# Patient Record
Sex: Female | Born: 1965
Health system: Southern US, Community
[De-identification: ages and names within clinical notes are randomized; demographics above are authoritative.]

## PROBLEM LIST (undated history)

## (undated) DIAGNOSIS — G43909 Migraine, unspecified, not intractable, without status migrainosus: Secondary | ICD-10-CM

## (undated) DIAGNOSIS — M810 Age-related osteoporosis without current pathological fracture: Secondary | ICD-10-CM

## (undated) DIAGNOSIS — F329 Major depressive disorder, single episode, unspecified: Secondary | ICD-10-CM

## (undated) DIAGNOSIS — F419 Anxiety disorder, unspecified: Secondary | ICD-10-CM

## (undated) DIAGNOSIS — J45909 Unspecified asthma, uncomplicated: Secondary | ICD-10-CM

## (undated) DIAGNOSIS — T7840XA Allergy, unspecified, initial encounter: Secondary | ICD-10-CM

## (undated) DIAGNOSIS — F32A Depression, unspecified: Secondary | ICD-10-CM

## (undated) HISTORY — PX: TONSILLECTOMY AND ADENOIDECTOMY: SHX28

## (undated) HISTORY — DX: Unspecified asthma, uncomplicated: J45.909

## (undated) HISTORY — DX: Major depressive disorder, single episode, unspecified: F32.9

## (undated) HISTORY — DX: Allergy, unspecified, initial encounter: T78.40XA

## (undated) HISTORY — DX: Age-related osteoporosis without current pathological fracture: M81.0

## (undated) HISTORY — DX: Migraine, unspecified, not intractable, without status migrainosus: G43.909

## (undated) HISTORY — DX: Depression, unspecified: F32.A

## (undated) HISTORY — PX: WISDOM TOOTH EXTRACTION: SHX21

---
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
## 2002-04-26 ENCOUNTER — Ambulatory Visit (HOSPITAL_COMMUNITY): Admission: RE | Admit: 2002-04-26 | Discharge: 2002-04-26 | Payer: Self-pay | Admitting: Obstetrics and Gynecology

## 2002-04-26 ENCOUNTER — Encounter: Payer: Self-pay | Admitting: Obstetrics and Gynecology

## 2002-06-04 ENCOUNTER — Other Ambulatory Visit: Admission: RE | Admit: 2002-06-04 | Discharge: 2002-06-04 | Payer: Self-pay | Admitting: Obstetrics and Gynecology

## 2003-02-28 ENCOUNTER — Inpatient Hospital Stay (HOSPITAL_COMMUNITY): Admission: AD | Admit: 2003-02-28 | Discharge: 2003-02-28 | Payer: Self-pay | Admitting: Obstetrics and Gynecology

## 2003-07-20 ENCOUNTER — Other Ambulatory Visit: Admission: RE | Admit: 2003-07-20 | Discharge: 2003-07-20 | Payer: Self-pay | Admitting: Obstetrics and Gynecology

## 2003-08-17 ENCOUNTER — Ambulatory Visit (HOSPITAL_COMMUNITY): Admission: RE | Admit: 2003-08-17 | Discharge: 2003-08-17 | Payer: Self-pay | Admitting: Obstetrics and Gynecology

## 2003-08-19 ENCOUNTER — Encounter: Admission: RE | Admit: 2003-08-19 | Discharge: 2003-08-19 | Payer: Self-pay | Admitting: Obstetrics and Gynecology

## 2003-10-12 ENCOUNTER — Inpatient Hospital Stay (HOSPITAL_COMMUNITY): Admission: AD | Admit: 2003-10-12 | Discharge: 2003-10-12 | Payer: Self-pay | Admitting: Obstetrics and Gynecology

## 2005-01-16 ENCOUNTER — Other Ambulatory Visit: Admission: RE | Admit: 2005-01-16 | Discharge: 2005-01-16 | Payer: Self-pay | Admitting: Obstetrics and Gynecology

## 2005-06-27 ENCOUNTER — Encounter: Admission: RE | Admit: 2005-06-27 | Discharge: 2005-06-27 | Payer: Self-pay | Admitting: Obstetrics and Gynecology

## 2012-07-06 ENCOUNTER — Encounter: Payer: Self-pay | Admitting: Family Medicine

## 2012-08-19 ENCOUNTER — Encounter: Payer: Self-pay | Admitting: Family Medicine

## 2012-08-19 ENCOUNTER — Ambulatory Visit (INDEPENDENT_AMBULATORY_CARE_PROVIDER_SITE_OTHER): Payer: BC Managed Care – PPO | Admitting: Family Medicine

## 2012-08-19 VITALS — BP 100/80 | HR 68 | Temp 97.7°F | Ht 64.5 in | Wt 106.2 lb

## 2012-08-19 DIAGNOSIS — J4520 Mild intermittent asthma, uncomplicated: Secondary | ICD-10-CM | POA: Insufficient documentation

## 2012-08-19 DIAGNOSIS — F418 Other specified anxiety disorders: Secondary | ICD-10-CM | POA: Insufficient documentation

## 2012-08-19 DIAGNOSIS — F3289 Other specified depressive episodes: Secondary | ICD-10-CM

## 2012-08-19 DIAGNOSIS — F329 Major depressive disorder, single episode, unspecified: Secondary | ICD-10-CM

## 2012-08-19 DIAGNOSIS — M810 Age-related osteoporosis without current pathological fracture: Secondary | ICD-10-CM | POA: Insufficient documentation

## 2012-08-19 DIAGNOSIS — F32A Depression, unspecified: Secondary | ICD-10-CM

## 2012-08-19 DIAGNOSIS — R55 Syncope and collapse: Secondary | ICD-10-CM

## 2012-08-19 DIAGNOSIS — J45909 Unspecified asthma, uncomplicated: Secondary | ICD-10-CM

## 2012-08-19 DIAGNOSIS — G43829 Menstrual migraine, not intractable, without status migrainosus: Secondary | ICD-10-CM

## 2012-08-19 LAB — CBC WITH DIFFERENTIAL/PLATELET
Basophils Absolute: 0.1 10*3/uL (ref 0.0–0.1)
HCT: 37.8 % (ref 36.0–46.0)
Hemoglobin: 12.5 g/dL (ref 12.0–15.0)
Lymphs Abs: 1.5 10*3/uL (ref 0.7–4.0)
MCV: 91.3 fl (ref 78.0–100.0)
Monocytes Absolute: 0.6 10*3/uL (ref 0.1–1.0)
Neutro Abs: 3.8 10*3/uL (ref 1.4–7.7)
Platelets: 230 10*3/uL (ref 150.0–400.0)
RDW: 13.6 % (ref 11.5–14.6)

## 2012-08-19 LAB — LDL CHOLESTEROL, DIRECT: Direct LDL: 133.6 mg/dL

## 2012-08-19 LAB — HEPATIC FUNCTION PANEL
Albumin: 4.1 g/dL (ref 3.5–5.2)
Alkaline Phosphatase: 43 U/L (ref 39–117)
Bilirubin, Direct: 0.1 mg/dL (ref 0.0–0.3)
Total Protein: 7.3 g/dL (ref 6.0–8.3)

## 2012-08-19 LAB — LIPID PANEL
Cholesterol: 228 mg/dL — ABNORMAL HIGH (ref 0–200)
HDL: 77.8 mg/dL (ref >39.00)
Triglycerides: 78 mg/dL (ref 0.0–149.0)

## 2012-08-19 LAB — BASIC METABOLIC PANEL
Calcium: 9.2 mg/dL (ref 8.4–10.5)
Creatinine, Ser: 0.6 mg/dL (ref 0.4–1.2)
GFR: 117.78 mL/min (ref >60.00)
Glucose, Bld: 84 mg/dL (ref 70–99)
Sodium: 136 mEq/L (ref 135–145)

## 2012-08-19 NOTE — Assessment & Plan Note (Signed)
New to provider, ongoing for pt.  Feels sxs are currently well controlled.  No changes.

## 2012-08-19 NOTE — Assessment & Plan Note (Signed)
New to provider, dx'd by GYN.  Not currently on bisphosphonate.  On Ca and Vit D daily.  Stressed importance of weight bearing exercises.  Will follow.

## 2012-08-19 NOTE — Assessment & Plan Note (Signed)
New to provider, ongoing for pt.  Only using Advair PRN- which is rarely.  Has not required albuterol recently.  No changes.  Will follow.

## 2012-08-19 NOTE — Assessment & Plan Note (Signed)
New.  No obvious cause- pt did have onset of menses that day.  Check labs to r/o metabolic cause.  Reviewed supportive care and red flags that should prompt return.  Pt expressed understanding and is in agreement w/ plan.

## 2012-08-19 NOTE — Patient Instructions (Addendum)
We'll notify you of your lab results and make any changes if needed Keep up the good work!  You look great! Call for refills, questions or concerns Welcome!  We're glad to have you!!!

## 2012-08-19 NOTE — Progress Notes (Signed)
  Subjective:    Patient ID: Jasmine Herrera, female    DOB: 09-Dec-1965, 47 y.o.   MRN: [id number]  HPI New to establish.  Previous MD- None.  GYN- Grewal  Migraines- chronic problem.  In the past has had as many as 9 per month.  Is now having less, ~6 per month.  Will take Relpax prn w/ good relief.  Migraines are hormonally mediated.  Will have 'mental clouding' prior to onset but no true aura.  + nausea.    Asthma- chronic problem.  Pt reports sxs have improved since starting sublingual allergy drops.  Trying to avoid using Advair due to osteoporosis.  Has run out of albuterol rescue inhaler 'years ago' and has not required one.  Will use Advair PRN.  No SOB, CP.  Osteoporosis- dx'd on DEXA 'a couple of years ago', not currently on meds.  Pt does take Ca and Vit D daily, walks regularly.  Depression- chronic problem, sxs started after divorce 3 yrs ago.  On Prozac.  Feels sxs are well controlled.  Sleeping well, eating regularly.  Presyncopal episode- occurred 05/12/12.  Had eaten, only 1 drink.  No hx of similar.  No episodes since.  Denies CP, SOB, visual changes, edema.   Review of Systems For ROS see HPI     Objective:   Physical Exam  Vitals reviewed. Constitutional: She is oriented to person, place, and time. She appears well-developed and well-nourished. No distress.  HENT:  Head: Normocephalic and atraumatic.  Eyes: Conjunctivae and EOM are normal. Pupils are equal, round, and reactive to light.  Neck: Normal range of motion. Neck supple. No thyromegaly present.  Cardiovascular: Normal rate, regular rhythm, normal heart sounds and intact distal pulses.   No murmur heard. Pulmonary/Chest: Effort normal and breath sounds normal. No respiratory distress.  Abdominal: Soft. She exhibits no distension. There is no tenderness.  Musculoskeletal: She exhibits no edema.  Lymphadenopathy:    She has no cervical adenopathy.  Neurological: She is alert and oriented to person, place, and  time. She has normal reflexes. No cranial nerve deficit. Coordination normal.  Skin: Skin is warm and dry.  Psychiatric: She has a normal mood and affect. Her behavior is normal.          Assessment & Plan:

## 2012-08-19 NOTE — Assessment & Plan Note (Signed)
New to provider, ongoing for pt.  Not currently seeing Neuro.  On triptan prn.  Reviewed supportive care and red flags that should prompt return.  Pt expressed understanding and is in agreement w/ plan.

## 2012-08-20 ENCOUNTER — Encounter: Payer: Self-pay | Admitting: *Deleted

## 2012-08-24 ENCOUNTER — Encounter: Payer: Self-pay | Admitting: *Deleted

## 2012-08-24 LAB — VITAMIN D 1,25 DIHYDROXY: Vitamin D 1, 25 (OH)2 Total: 70 pg/mL (ref 18–72)

## 2013-01-04 ENCOUNTER — Telehealth: Payer: Self-pay | Admitting: Family Medicine

## 2013-01-04 NOTE — Telephone Encounter (Deleted)
Patient is calling to

## 2013-01-12 NOTE — Telephone Encounter (Signed)
error 

## 2013-02-25 ENCOUNTER — Other Ambulatory Visit: Payer: Self-pay | Admitting: Family Medicine

## 2013-02-25 NOTE — Telephone Encounter (Signed)
Patient is calling to request a refill on the following prescriptions:  eletriptan (RELPAX) 40 MG tablet (requesting 90 days) FLUoxetine (PROZAC) 20 MG capsule   She uses [facility].

## 2013-02-26 MED ORDER — ELETRIPTAN HYDROBROMIDE 40 MG PO TABS: 40.0000 mg | ORAL_TABLET | ORAL | Status: DC | PRN

## 2013-02-26 MED ORDER — FLUOXETINE HCL 20 MG PO CAPS: 20.0000 mg | ORAL_CAPSULE | Freq: Every day | ORAL | Status: DC

## 2013-02-26 NOTE — Telephone Encounter (Signed)
Meds filled

## 2013-02-26 NOTE — Telephone Encounter (Signed)
Pt last seen in April, no record of [place] previously filling medication. Please advise.

## 2013-04-19 ENCOUNTER — Telehealth: Payer: Self-pay | Admitting: Family Medicine

## 2013-04-19 NOTE — Telephone Encounter (Signed)
Ok for refill- change quantity to #12, 3 refills

## 2013-04-19 NOTE — Telephone Encounter (Signed)
[facility] patient. Please advise     KP

## 2013-04-19 NOTE — Telephone Encounter (Signed)
Patient called and stated that she ran out of her migraine medication because she had a ruff month. Patient wanted to see if dr Laury Axon could increase the quantity to 12 or write her a new rx for eletriptan (RELPAX) 40 MG tablet. Also patient states that we might have to call her insurance to see if they will cover it or recommend a new prescription for her.     Pharmacy [address] - [place], [place] - [address]

## 2013-04-21 ENCOUNTER — Other Ambulatory Visit: Payer: Self-pay | Admitting: *Deleted

## 2013-04-21 DIAGNOSIS — G43909 Migraine, unspecified, not intractable, without status migrainosus: Secondary | ICD-10-CM

## 2013-04-21 MED ORDER — ELETRIPTAN HYDROBROMIDE 40 MG PO TABS: 40.0000 mg | ORAL_TABLET | ORAL | Status: DC | PRN

## 2013-04-21 NOTE — Telephone Encounter (Signed)
Refill for Relpax sent to [facility] [address] for #12 with 3 refills

## 2013-06-08 ENCOUNTER — Encounter: Payer: Self-pay | Admitting: Emergency Medicine

## 2013-06-08 ENCOUNTER — Ambulatory Visit (INDEPENDENT_AMBULATORY_CARE_PROVIDER_SITE_OTHER): Payer: BC Managed Care – PPO | Admitting: Emergency Medicine

## 2013-06-08 VITALS — BP 100/60 | HR 90 | Temp 98.2°F | Resp 16 | Ht 64.0 in | Wt 104.0 lb

## 2013-06-08 DIAGNOSIS — R350 Frequency of micturition: Secondary | ICD-10-CM

## 2013-06-08 DIAGNOSIS — N390 Urinary tract infection, site not specified: Secondary | ICD-10-CM

## 2013-06-08 LAB — POCT URINALYSIS DIPSTICK
BILIRUBIN UA: NEGATIVE
Glucose, UA: NEGATIVE
Ketones, UA: NEGATIVE
NITRITE UA: NEGATIVE
Protein, UA: NEGATIVE
Spec Grav, UA: <=1.005
Urobilinogen, UA: 0.2
pH, UA: 6.5

## 2013-06-08 LAB — POCT UA - MICROSCOPIC ONLY
Casts, Ur, LPF, POC: NEGATIVE
Crystals, Ur, HPF, POC: NEGATIVE
MUCUS UA: NEGATIVE
YEAST UA: NEGATIVE

## 2013-06-08 MED ORDER — CIPROFLOXACIN HCL 500 MG PO TABS: 500.0000 mg | ORAL_TABLET | Freq: Two times a day (BID) | ORAL | Status: DC

## 2013-06-08 NOTE — Patient Instructions (Signed)
Urinary Tract Infection  Urinary tract infections (UTIs) can develop anywhere along your urinary tract. Your urinary tract is your body's drainage system for removing wastes and extra water. Your urinary tract includes two kidneys, two ureters, a bladder, and a urethra. Your kidneys are a pair of bean-shaped organs. Each kidney is about the size of your fist. They are located below your ribs, one on each side of your spine.  CAUSES  Infections are caused by microbes, which are microscopic organisms, including fungi, viruses, and bacteria. These organisms are so small that they can only be seen through a microscope. Bacteria are the microbes that most commonly cause UTIs.  SYMPTOMS   Symptoms of UTIs may vary by age and gender of the patient and by the location of the infection. Symptoms in young women typically include a frequent and intense urge to urinate and a painful, burning feeling in the bladder or urethra during urination. Older women and men are more likely to be tired, shaky, and weak and have muscle aches and abdominal pain. A fever may mean the infection is in your kidneys. Other symptoms of a kidney infection include pain in your back or sides below the ribs, nausea, and vomiting.  DIAGNOSIS  To diagnose a UTI, your caregiver will ask you about your symptoms. Your caregiver also will ask to provide a urine sample. The urine sample will be tested for bacteria and white blood cells. White blood cells are made by your body to help fight infection.  TREATMENT   Typically, UTIs can be treated with medication. Because most UTIs are caused by a bacterial infection, they usually can be treated with the use of antibiotics. The choice of antibiotic and length of treatment depend on your symptoms and the type of bacteria causing your infection.  HOME CARE INSTRUCTIONS   If you were prescribed antibiotics, take them exactly as your caregiver instructs you. Finish the medication even if you feel better after you  have only taken some of the medication.   Drink enough water and fluids to keep your urine clear or pale yellow.   Avoid caffeine, tea, and carbonated beverages. They tend to irritate your bladder.   Empty your bladder often. Avoid holding urine for long periods of time.   Empty your bladder before and after sexual intercourse.   After a bowel movement, women should cleanse from front to back. Use each tissue only once.  SEEK MEDICAL CARE IF:    You have back pain.   You develop a fever.   Your symptoms do not begin to resolve within 3 days.  SEEK IMMEDIATE MEDICAL CARE IF:    You have severe back pain or lower abdominal pain.   You develop chills.   You have nausea or vomiting.   You have continued burning or discomfort with urination.  MAKE SURE YOU:    Understand these instructions.   Will watch your condition.   Will get help right away if you are not doing well or get worse.  Document Released: 02/06/2005 Document Revised: 10/29/2011 Document Reviewed: 06/07/2011  ExitCare Patient Information 2014 [employer].

## 2013-06-08 NOTE — Progress Notes (Signed)
   Subjective:    Patient ID: Jasmine Herrera, female    DOB: 12/27/1965, 48 y.o.   MRN: [id number]  HPI Urinary frequency with increased urination since last night and lower abdominal discomfort.  No fever or chills.  No back pain.  Similar symptoms in past, several years ago.    PPMH:  No diabetes, no htn.  SH:  Nonsmoker, no alcohol.  ALL:  PCN  Review of Systems  Constitutional: Negative for fever, chills, diaphoresis, appetite change and fatigue.  Respiratory: Negative for cough, chest tightness and shortness of breath.   Gastrointestinal: Negative for nausea, diarrhea and constipation.  Genitourinary: Positive for urgency and frequency. Negative for flank pain.  Neurological: Negative for numbness and headaches.       Objective:   Physical Exam Blood pressure 100/60, pulse 90, temperature 98.2 F (36.8 C), temperature source Oral, resp. rate 16, height 5\' 4"  (1.626 m), weight 104 lb (47.174 kg), last menstrual period 05/11/2013, SpO2 98.00%. Body mass index is 17.84 kg/(m^2). Well-developed, well nourished female who is awake, alert and oriented, in NAD. HEENT: [place]/AT, PERRL, EOMI.  Sclera and conjunctiva are clear. Neck: supple, non-tender Heart: RRR, no murmur Lungs: normal effort, CTA Abdomen: normo-active bowel sounds, supple, non-tender, no mass or organomegaly. Extremities: no cyanosis, clubbing or edema. Skin: warm and dry without rash. Psychologic: good mood and appropriate affect, normal speech and behavior.   Results for orders placed in visit on 06/08/13  POCT UA - MICROSCOPIC ONLY      Result Value Range   WBC, Ur, HPF, POC tntc     RBC, urine, microscopic 0-3     Bacteria, U Microscopic 3+     Mucus, UA neg     Epithelial cells, urine per micros 5-10     Crystals, Ur, HPF, POC neg     Casts, Ur, LPF, POC neg     Yeast, UA neg    POCT URINALYSIS DIPSTICK      Result Value Range   Color, UA light yellow     Clarity, UA hazy     Glucose, UA neg     Bilirubin, UA neg     Ketones, UA neg     Spec Grav, UA <=1.005     Blood, UA moderate     pH, UA 6.5     Protein, UA neg     Urobilinogen, UA 0.2     Nitrite, UA neg     Leukocytes, UA large (3+)          Assessment & Plan:  UTI  Will treat with cipro x 5 days.  Return if worsen.

## 2013-06-30 ENCOUNTER — Other Ambulatory Visit: Payer: Self-pay | Admitting: Family Medicine

## 2013-06-30 NOTE — Telephone Encounter (Signed)
Last OV 08-19-12  Med filled 04-21-13 #12 with 3 refills

## 2013-07-20 ENCOUNTER — Other Ambulatory Visit: Payer: Self-pay | Admitting: General Practice

## 2013-07-20 DIAGNOSIS — G43909 Migraine, unspecified, not intractable, without status migrainosus: Secondary | ICD-10-CM

## 2013-07-20 MED ORDER — FLUTICASONE-SALMETEROL 250-50 MCG/DOSE IN AEPB: 1.0000 | INHALATION_SPRAY | RESPIRATORY_TRACT | Status: DC | PRN

## 2013-07-20 MED ORDER — ELETRIPTAN HYDROBROMIDE 40 MG PO TABS: 40.0000 mg | ORAL_TABLET | ORAL | Status: DC | PRN

## 2013-07-20 NOTE — Telephone Encounter (Signed)
Med filled.  

## 2013-07-26 ENCOUNTER — Telehealth: Payer: Self-pay | Admitting: *Deleted

## 2013-07-26 NOTE — Telephone Encounter (Signed)
Prior authorization paperwork faxed for Relpax 40 mg. Awaiting response. JG//CMA

## 2013-07-27 NOTE — Telephone Encounter (Signed)
Patient called and was upset that her pharmacy have sent numerous of request to refill her Relpax. Also she wanted to see if it could be approved for 9 tablets instead of 6 and is there a way to not have to go through this every month to get this refilled. Patient would like a phone call today.

## 2013-07-27 NOTE — Telephone Encounter (Signed)
Patient made aware that prior authorization paperwork has been faxed for Replax 40mg .  Patient requested that will call insurance company regarding medication.  [facility] asked to re-fax prior authorization form.  Awaiting fax.

## 2013-07-28 NOTE — Telephone Encounter (Signed)
Called and spoke with [employer] [place] concerning PA for Relpax. They stated that the decision is pending and we would receive a decision as soon as possible. JG//CMA

## 2013-07-28 NOTE — Telephone Encounter (Signed)
Received confirmation that PA was approved effective from 07/28/2013 through 05/12/2038. JG//CMA

## 2013-07-28 NOTE — Telephone Encounter (Signed)
Murtis Sink, CMA to check on update of prior authorization status.

## 2013-10-11 LAB — HM PAP SMEAR: HM PAP: NORMAL

## 2013-11-08 ENCOUNTER — Other Ambulatory Visit: Payer: Self-pay | Admitting: Obstetrics and Gynecology

## 2013-11-08 DIAGNOSIS — N6001 Solitary cyst of right breast: Secondary | ICD-10-CM

## 2013-11-26 ENCOUNTER — Ambulatory Visit
Admission: RE | Admit: 2013-11-26 | Discharge: 2013-11-26 | Disposition: A | Discharge status: Home / Self Care | Payer: BC Managed Care – PPO | Source: Ambulatory Visit | Attending: Obstetrics and Gynecology | Admitting: Obstetrics and Gynecology

## 2013-11-26 DIAGNOSIS — N6001 Solitary cyst of right breast: Secondary | ICD-10-CM

## 2014-02-19 ENCOUNTER — Other Ambulatory Visit: Payer: Self-pay | Admitting: Family Medicine

## 2014-02-19 NOTE — Telephone Encounter (Signed)
Last OV 08-19-12 Med filled 07-20-13 #60 with 0

## 2014-02-20 NOTE — Telephone Encounter (Signed)
This is pt's last refill w/o appt.  Has not been seen in 18 months.

## 2014-02-21 NOTE — Telephone Encounter (Signed)
Med filled and faxed.  

## 2014-06-09 ENCOUNTER — Encounter: Payer: Self-pay | Admitting: Family Medicine

## 2014-06-09 ENCOUNTER — Ambulatory Visit (INDEPENDENT_AMBULATORY_CARE_PROVIDER_SITE_OTHER): Payer: 59 | Admitting: Family Medicine

## 2014-06-09 VITALS — BP 110/80 | HR 118 | Temp 98.2°F | Resp 16 | Wt 102.1 lb

## 2014-06-09 DIAGNOSIS — N390 Urinary tract infection, site not specified: Secondary | ICD-10-CM

## 2014-06-09 DIAGNOSIS — G43829 Menstrual migraine, not intractable, without status migrainosus: Secondary | ICD-10-CM

## 2014-06-09 DIAGNOSIS — F32A Depression, unspecified: Secondary | ICD-10-CM

## 2014-06-09 DIAGNOSIS — F329 Major depressive disorder, single episode, unspecified: Secondary | ICD-10-CM

## 2014-06-09 DIAGNOSIS — J45998 Other asthma: Secondary | ICD-10-CM

## 2014-06-09 DIAGNOSIS — N943 Premenstrual tension syndrome: Secondary | ICD-10-CM

## 2014-06-09 DIAGNOSIS — R82998 Other abnormal findings in urine: Secondary | ICD-10-CM

## 2014-06-09 DIAGNOSIS — R300 Dysuria: Secondary | ICD-10-CM

## 2014-06-09 DIAGNOSIS — J45909 Unspecified asthma, uncomplicated: Secondary | ICD-10-CM

## 2014-06-09 LAB — POCT URINALYSIS DIPSTICK
Bilirubin, UA: NEGATIVE
Glucose, UA: NEGATIVE
Ketones, UA: NEGATIVE
Nitrite, UA: NEGATIVE
PROTEIN UA: NEGATIVE
RBC UA: NEGATIVE
SPEC GRAV UA: 1.015
Urobilinogen, UA: 2
pH, UA: 7

## 2014-06-09 MED ORDER — ELETRIPTAN HYDROBROMIDE 40 MG PO TABS: 40.0000 mg | ORAL_TABLET | Freq: Once | ORAL | Status: DC

## 2014-06-09 MED ORDER — SUMATRIPTAN SUCCINATE 100 MG PO TABS: 100.0000 mg | ORAL_TABLET | Freq: Once | ORAL | Status: DC

## 2014-06-09 MED ORDER — FLUCONAZOLE 150 MG PO TABS: 150.0000 mg | ORAL_TABLET | Freq: Once | ORAL | Status: DC

## 2014-06-09 MED ORDER — FLUOXETINE HCL 20 MG PO TABS: 20.0000 mg | ORAL_TABLET | Freq: Every day | ORAL | Status: DC

## 2014-06-09 MED ORDER — ALBUTEROL SULFATE HFA 108 (90 BASE) MCG/ACT IN AERS: 2.0000 | INHALATION_SPRAY | Freq: Four times a day (QID) | RESPIRATORY_TRACT | Status: DC | PRN

## 2014-06-09 MED ORDER — CIPROFLOXACIN HCL 500 MG PO TABS: 500.0000 mg | ORAL_TABLET | Freq: Two times a day (BID) | ORAL | Status: DC

## 2014-06-09 NOTE — Assessment & Plan Note (Signed)
New.  sxs and UA consistent w/ infxn.  Start abx.  Diflucan in case of yeast.  Pt expressed understanding and is in agreement w/ plan.

## 2014-06-09 NOTE — Progress Notes (Signed)
Pre visit review using our clinic review tool, if applicable. No additional management support is needed unless otherwise documented below in the visit note. 

## 2014-06-09 NOTE — Progress Notes (Signed)
   Subjective:    Patient ID: Jasmine Herrera, female    DOB: 1966-04-16, 49 y.o.   MRN: [id number]  HPI Migraine- chronic problem, pt is anticipating those improving w/ menopause.  Relpax is very effective but due to insurance restrictions will take generic imitrex (which only works when taken early in HA course)  Depression- chronic problem, on Prozac.  Pt occasionally wonders if meds are working but on the other hand would like to be off meds entirely.  Asthma- chronic problem, well controlled at this time and only using med as a rescue inhaler.  Doesn't have albuterol rescue inhaler.  Dysuria- sxs started 3-4 days ago.  Increased frequency, urgency, incomplete emptying.   Review of Systems For ROS see HPI   Reviewed meds, allergies, problem list, and PMH in chart     Objective:   Physical Exam  Constitutional: She is oriented to person, place, and time. She appears well-developed and well-nourished. No distress.  HENT:  Head: Normocephalic and atraumatic.  Eyes: Conjunctivae and EOM are normal. Pupils are equal, round, and reactive to light.  Neck: Normal range of motion. Neck supple. No thyromegaly present.  Cardiovascular: Normal rate, regular rhythm, normal heart sounds and intact distal pulses.   No murmur heard. Pulmonary/Chest: Effort normal and breath sounds normal. No respiratory distress.  Musculoskeletal: She exhibits no edema.  Lymphadenopathy:    She has no cervical adenopathy.  Neurological: She is alert and oriented to person, place, and time.  Skin: Skin is warm and dry.  Psychiatric: She has a normal mood and affect. Her behavior is normal.  Vitals reviewed.         Assessment & Plan:   Problem List Items Addressed This Visit    Menstrually related migraine    Chronic problem.  sxs are still menstrually related but now that menses are spacing out, sxs are less frequent.  Will use Imitrex for more mild HAs, Relpax for more severe HAs.  Refills  provided.      Relevant Medications   eletriptan (RELPAX) tablet   FLUoxetine (PROZAC) tablet   SUMAtriptan (IMITREX) tablet   Depression    Chronic problem.  Pt is not sure whether she wants to increase Prozac to 40mg  daily.  Pt prefers to hold off on increasing at this time but discussed that she can call me if sxs worsen and we can increase via phone.  Pt expressed understanding and is in agreement w/ plan.       Relevant Medications   FLUoxetine (PROZAC) tablet   Asthma, mild    Chronic problem.  Pt is using Advair as a rescue inhaler.  Stop Advair.  Start Albuterol as needed for shortness of breath/wheezing.  Pt is to monitor frequency of albuterol use to determine whether controller medication is required.  Pt expressed understanding and is in agreement w/ plan.       Relevant Medications   albuterol (PROVENTIL HFA;VENTOLIN HFA) inhaler   Dysuria - Primary    New.  sxs and UA consistent w/ infxn.  Start abx.  Diflucan in case of yeast.  Pt expressed understanding and is in agreement w/ plan.       Relevant Orders   POCT urinalysis dipstick (Completed)    Other Visit Diagnoses    Leukocytes in urine        Relevant Orders    Urine culture

## 2014-06-09 NOTE — Addendum Note (Signed)
Addended by: Peggyann Shoals on: 06/09/2014 02:46 PM   Modules accepted: Orders

## 2014-06-09 NOTE — Patient Instructions (Addendum)
Schedule your complete physical in 6 months Call if you decide you want to go up on the Prozac STOP the Advair Use the Albuterol inhaler as needed- if you find yourself using this more than a few times a week, call me Start the Cipro twice daily x3 days Drink plenty of fluids Take the Diflucan as needed for yeast Call with any questions or concerns Happy New Year!!!

## 2014-06-09 NOTE — Assessment & Plan Note (Signed)
Chronic problem.  sxs are still menstrually related but now that menses are spacing out, sxs are less frequent.  Will use Imitrex for more mild HAs, Relpax for more severe HAs.  Refills provided.

## 2014-06-09 NOTE — Assessment & Plan Note (Signed)
Chronic problem.  Pt is not sure whether she wants to increase Prozac to 40mg  daily.  Pt prefers to hold off on increasing at this time but discussed that she can call me if sxs worsen and we can increase via phone.  Pt expressed understanding and is in agreement w/ plan.

## 2014-06-09 NOTE — Assessment & Plan Note (Signed)
Chronic problem.  Pt is using Advair as a rescue inhaler.  Stop Advair.  Start Albuterol as needed for shortness of breath/wheezing.  Pt is to monitor frequency of albuterol use to determine whether controller medication is required.  Pt expressed understanding and is in agreement w/ plan.

## 2014-06-11 LAB — URINE CULTURE

## 2014-08-22 ENCOUNTER — Ambulatory Visit (INDEPENDENT_AMBULATORY_CARE_PROVIDER_SITE_OTHER): Payer: 59 | Admitting: Medical

## 2014-08-22 ENCOUNTER — Encounter: Payer: Self-pay | Admitting: Medical

## 2014-08-22 VITALS — BP 110/74 | HR 84 | Temp 98.3°F | Ht 64.0 in | Wt 102.0 lb

## 2014-08-22 DIAGNOSIS — M609 Myositis, unspecified: Secondary | ICD-10-CM

## 2014-08-22 DIAGNOSIS — J0100 Acute maxillary sinusitis, unspecified: Secondary | ICD-10-CM

## 2014-08-22 DIAGNOSIS — J301 Allergic rhinitis due to pollen: Secondary | ICD-10-CM | POA: Diagnosis not present

## 2014-08-22 DIAGNOSIS — M791 Myalgia: Secondary | ICD-10-CM

## 2014-08-22 DIAGNOSIS — IMO0001 Reserved for inherently not codable concepts without codable children: Secondary | ICD-10-CM | POA: Insufficient documentation

## 2014-08-22 DIAGNOSIS — J309 Allergic rhinitis, unspecified: Secondary | ICD-10-CM | POA: Insufficient documentation

## 2014-08-22 DIAGNOSIS — J329 Chronic sinusitis, unspecified: Secondary | ICD-10-CM | POA: Insufficient documentation

## 2014-08-22 LAB — POCT INFLUENZA A/B
INFLUENZA A, POC: NEGATIVE
Influenza B, POC: NEGATIVE

## 2014-08-22 MED ORDER — AZITHROMYCIN 250 MG PO TABS: ORAL_TABLET | ORAL | Status: DC

## 2014-08-22 MED ORDER — MOMETASONE FUROATE 50 MCG/ACT NA SUSP: NASAL | Status: DC

## 2014-08-22 MED ORDER — BENZONATATE 100 MG PO CAPS: 100.0000 mg | ORAL_CAPSULE | Freq: Three times a day (TID) | ORAL | Status: DC | PRN

## 2014-08-22 NOTE — Assessment & Plan Note (Signed)
I think mild early allergies with possible sinus infections.  Rx nasonex. If not covered then flonase otc.  Oral anthistamine of your choice.  If sinus pressure persists then azithromycin.

## 2014-08-22 NOTE — Progress Notes (Signed)
Pre visit review using our clinic review tool, if applicable. No additional management support is needed unless otherwise documented below in the visit note. 

## 2014-08-22 NOTE — Progress Notes (Signed)
Subjective:    Patient ID: Jasmine Herrera, female    DOB: [birth year], 49 y.o.   MRN: [id number]  HPI   Pt in with some cough, nasal congestion and just last night moderate achiness all over. Mild scratchy st. With some sinus pressure last 2 days. Achiness has now resolved.  Some sneeze and some itching eyes.(Pt allergic to everything per report. She is outdoors a lot). Used to be on immunotherapy.    Review of Systems  Constitutional: Negative for fever, chills and fatigue.  HENT: Positive for congestion, sinus pressure and sneezing. Negative for sore throat.   Respiratory: Positive for cough. Negative for chest tightness and wheezing.   Cardiovascular: Negative for chest pain and palpitations.  Musculoskeletal: Positive for myalgias.       Moderate to severe almost flu like last night but today not very significant achiness.  Neurological: Negative for dizziness, syncope, speech difficulty, weakness, light-headedness and headaches.  Hematological: Negative for adenopathy. Does not bruise/bleed easily.   Past Medical History  Diagnosis Date  . Migraine   . Allergy   . Asthma   . Depression     History   Social History  . Marital Status: Married    Spouse Name: N/A  . Number of Children: N/A  . Years of Education: N/A   Occupational History  . Not on file.   Social History Main Topics  . Smoking status: Never Smoker   . Smokeless tobacco: Not on file  . Alcohol Use: Yes     Comment: occa.  . Drug Use: No  . Sexual Activity: Not on file   Other Topics Concern  . Not on file   Social History Narrative    Past Surgical History  Procedure Laterality Date  . Fracture surgery      No family history on file.  Allergies  Allergen Reactions  . Penicillins     Current Outpatient Prescriptions on File Prior to Visit  Medication Sig Dispense Refill  . albuterol (PROVENTIL HFA;VENTOLIN HFA) 108 (90 BASE) MCG/ACT inhaler Inhale 2 puffs into the lungs every 6  (six) hours as needed for wheezing or shortness of breath. 1 Inhaler 2  . eletriptan (RELPAX) 40 MG tablet Take 1 tablet (40 mg total) by mouth once. may repeat in 2 hours if necessary 12 tablet 3  . FLUoxetine (PROZAC) 20 MG tablet Take 1 tablet (20 mg total) by mouth daily. 30 tablet 3  . SUMAtriptan (IMITREX) 100 MG tablet Take 1 tablet (100 mg total) by mouth once. May repeat in 2 hrs if needed 10 tablet 4   No current facility-administered medications on file prior to visit.    BP 110/74 mmHg  Pulse 84  Temp(Src) 98.3 F (36.8 C) (Oral)  Ht 5\' 4"  (1.626 m)  Wt 102 lb (46.267 kg)  BMI 17.50 kg/m2  SpO2 99%  LMP 08/12/2014      Objective:   Physical Exam  General  Mental Status - Alert. General Appearance - Well groomed. Not in acute distress.  Skin Rashes- No Rashes.  HEENT Head- Normal. Ear Auditory Canal - Left- Normal. Right - Normal.Tympanic Membrane- Left- Normal. Right- Normal. Eye Sclera/Conjunctiva- Left- Normal. Right- Normal. Nose & Sinuses Nasal Mucosa- Left-  Boggy and Congested. Right-  Boggy and  Congested.Faint  maxillary and frontal sinus pressure. Mouth & Throat Lips: Upper Lip- Normal: no dryness, cracking, pallor, cyanosis, or vesicular eruption. Lower Lip-Normal: no dryness, cracking, pallor, cyanosis or vesicular eruption. Buccal Mucosa- Bilateral-  No Aphthous ulcers. Oropharynx- No Discharge or Erythema. +pnd. Tonsils: Characteristics- Bilateral- No Erythema or Congestion. Size/Enlargement- Bilateral- No enlargement. Discharge- bilateral-None.  Neck Neck- Supple. No Masses.   Chest and Lung Exam Auscultation: Breath Sounds:-Clear even and unlabored.  Cardiovascular Auscultation:Rythm- Regular, rate and rhythm. Murmurs & Other Heart Sounds:Ausculatation of the heart reveal- No Murmurs.  Lymphatic Head & Neck General Head & Neck Lymphatics: Bilateral: Description- No Localized lymphadenopathy.       Assessment & Plan:

## 2014-08-22 NOTE — Assessment & Plan Note (Addendum)
Will get flu test today since some aches last night. Test was negative. I think you do not have the flu. However false negative test area possible. So if within next 48 hours you present flu like then let us know and would rx tamiflu to your pharmacy.

## 2014-08-22 NOTE — Patient Instructions (Signed)
Myalgia and myositis Will get flu test today since some aches last night. Test was negative. I think you do not have the flu. However false negative test area possible. So if within next 48 hours you present flu like then let us know and would rx tamiflu to your pharmacy.   Allergic rhinitis I think mild early allergies with possible sinus infections.  Rx nasonex. If not covered then flonase otc.  Oral anthistamine of your choice.  If sinus pressure persists then azithromycin.    Follow up in 7 days or as needed.  Rx of benzonatate cough med as well.

## 2014-08-29 ENCOUNTER — Telehealth: Payer: Self-pay | Admitting: *Deleted

## 2014-08-29 NOTE — Telephone Encounter (Signed)
Prior authorization for mometasone furoate inhaler initiated. Awaiting determination. JG//CMA

## 2014-08-29 NOTE — Telephone Encounter (Signed)
PA approved through 08/29/2015. JG//CMA

## 2014-10-17 ENCOUNTER — Other Ambulatory Visit: Payer: Self-pay | Admitting: Family Medicine

## 2014-10-17 NOTE — Telephone Encounter (Signed)
Med filled.  

## 2014-11-16 ENCOUNTER — Telehealth: Payer: Self-pay | Admitting: Family Medicine

## 2014-11-16 NOTE — Telephone Encounter (Signed)
Pre visit letter mailed 11/16/14

## 2014-12-06 ENCOUNTER — Telehealth: Payer: Self-pay | Admitting: Behavioral Health

## 2014-12-06 NOTE — Telephone Encounter (Signed)
Unable to reach patient at time of Pre-Visit Call.  Left message for patient to return call when available.    

## 2014-12-07 ENCOUNTER — Ambulatory Visit (INDEPENDENT_AMBULATORY_CARE_PROVIDER_SITE_OTHER): Payer: 59 | Admitting: Family Medicine

## 2014-12-07 ENCOUNTER — Encounter: Payer: Self-pay | Admitting: Family Medicine

## 2014-12-07 ENCOUNTER — Encounter: Payer: Self-pay | Admitting: Gastroenterology

## 2014-12-07 VITALS — BP 102/64 | HR 66 | Temp 98.2°F | Ht 64.0 in | Wt 103.2 lb

## 2014-12-07 DIAGNOSIS — Z1211 Encounter for screening for malignant neoplasm of colon: Secondary | ICD-10-CM | POA: Diagnosis not present

## 2014-12-07 DIAGNOSIS — Z1231 Encounter for screening mammogram for malignant neoplasm of breast: Secondary | ICD-10-CM | POA: Diagnosis not present

## 2014-12-07 DIAGNOSIS — Z87898 Personal history of other specified conditions: Secondary | ICD-10-CM | POA: Diagnosis not present

## 2014-12-07 DIAGNOSIS — Z0000 Encounter for general adult medical examination without abnormal findings: Secondary | ICD-10-CM | POA: Diagnosis not present

## 2014-12-07 DIAGNOSIS — Z8709 Personal history of other diseases of the respiratory system: Secondary | ICD-10-CM

## 2014-12-07 LAB — CBC WITH DIFFERENTIAL/PLATELET
BASOS PCT: 0.6 % (ref 0.0–3.0)
Basophils Absolute: 0 10*3/uL (ref 0.0–0.1)
Eosinophils Absolute: 0.3 10*3/uL (ref 0.0–0.7)
Eosinophils Relative: 5.4 % — ABNORMAL HIGH (ref 0.0–5.0)
HCT: 37.4 % (ref 36.0–46.0)
HEMOGLOBIN: 12.8 g/dL (ref 12.0–15.0)
LYMPHS PCT: 31.9 % (ref 12.0–46.0)
Lymphs Abs: 1.6 10*3/uL (ref 0.7–4.0)
MCHC: 34.3 g/dL (ref 30.0–36.0)
MCV: 90.5 fl (ref 78.0–100.0)
MONO ABS: 0.4 10*3/uL (ref 0.1–1.0)
MONOS PCT: 8.7 % (ref 3.0–12.0)
NEUTROS PCT: 53.4 % (ref 43.0–77.0)
Neutro Abs: 2.7 10*3/uL (ref 1.4–7.7)
Platelets: 257 10*3/uL (ref 150.0–400.0)
RBC: 4.14 Mil/uL (ref 3.87–5.11)
RDW: 14.1 % (ref 11.5–15.5)
WBC: 5.1 10*3/uL (ref 4.0–10.5)

## 2014-12-07 LAB — LIPID PANEL
CHOLESTEROL: 202 mg/dL — AB (ref 0–200)
HDL: 69.2 mg/dL (ref >39.00)
LDL Cholesterol: 114 mg/dL — ABNORMAL HIGH (ref 0–99)
NonHDL: 132.8
Total CHOL/HDL Ratio: 3
Triglycerides: 93 mg/dL (ref 0.0–149.0)
VLDL: 18.6 mg/dL (ref 0.0–40.0)

## 2014-12-07 LAB — HEPATIC FUNCTION PANEL
ALBUMIN: 3.9 g/dL (ref 3.5–5.2)
ALT: 9 U/L (ref 0–35)
AST: 15 U/L (ref 0–37)
Alkaline Phosphatase: 44 U/L (ref 39–117)
BILIRUBIN DIRECT: 0.1 mg/dL (ref 0.0–0.3)
Total Bilirubin: 0.5 mg/dL (ref 0.2–1.2)
Total Protein: 6.7 g/dL (ref 6.0–8.3)

## 2014-12-07 LAB — BASIC METABOLIC PANEL
BUN: 10 mg/dL (ref 6–23)
CO2: 29 mEq/L (ref 19–32)
Calcium: 9.1 mg/dL (ref 8.4–10.5)
Chloride: 103 mEq/L (ref 96–112)
Creatinine, Ser: 0.56 mg/dL (ref 0.40–1.20)
GFR: 121.49 mL/min (ref >60.00)
Glucose, Bld: 80 mg/dL (ref 70–99)
Potassium: 4.3 mEq/L (ref 3.5–5.1)
Sodium: 136 mEq/L (ref 135–145)

## 2014-12-07 LAB — TSH: TSH: 1.18 u[IU]/mL (ref 0.35–4.50)

## 2014-12-07 LAB — VITAMIN D 25 HYDROXY (VIT D DEFICIENCY, FRACTURES): VITD: 22.77 ng/mL — AB (ref 30.00–100.00)

## 2014-12-07 MED ORDER — VITAMIN D (ERGOCALCIFEROL) 1.25 MG (50000 UNIT) PO CAPS: 50000.0000 [IU] | ORAL_CAPSULE | ORAL | Status: DC

## 2014-12-07 NOTE — Addendum Note (Signed)
Addended by: Wilfrid Lund on: 12/07/2014 03:55 PM   Modules accepted: Orders

## 2014-12-07 NOTE — Progress Notes (Signed)
Pre visit review using our clinic review tool, if applicable. No additional management support is needed unless otherwise documented below in the visit note. 

## 2014-12-07 NOTE — Patient Instructions (Signed)
Follow up in 3-4 months to recheck anxiety/depression We'll notify you of your lab results and make any changes if needed We'll call you with your ENT and GI appts Make sure you are eating regularly Call with any questions or concerns Enjoy the rest of your summer!!!

## 2014-12-07 NOTE — Assessment & Plan Note (Addendum)
Pt's PE WNL w/ exception of being underweight.  UTD on pap w/ Dr [name].  Due for [facility]- order entered.  Due for colonoscopy- referral placed.  Encouraged pt to eat regularly.  Check labs.  Anticipatory guidance provided.

## 2014-12-07 NOTE — Progress Notes (Signed)
   Subjective:    Patient ID: Jasmine Herrera, female    DOB: 05/20/1965, 49 y.o.   MRN: [id number]  HPI CPE- GYN- Jasmine Herrera, last pap 1 yr ago.  Due for [place].  Due for colonoscopy.   Review of Systems Patient reports no vision/ hearing changes, adenopathy,fever, weight change,  persistant/recurrent hoarseness , swallowing issues, chest pain, palpitations, edema, persistant/recurrent cough, hemoptysis, dyspnea (rest/exertional/paroxysmal nocturnal), gastrointestinal bleeding (melena, rectal bleeding), abdominal pain, significant heartburn, bowel changes, GU symptoms (dysuria, hematuria, incontinence), Gyn symptoms (abnormal  bleeding, pain),  syncope, focal weakness, memory loss, numbness & tingling, skin/hair/nail changes, abnormal bruising or bleeding, anxiety, or depression.   + hx of nasal congestion w/ polyp    Objective:   Physical Exam General Appearance:    Alert, cooperative, no distress, appears stated age  Head:    Normocephalic, without obvious abnormality, atraumatic  Eyes:    PERRL, conjunctiva/corneas clear, EOM's intact, fundi    benign, both eyes  Ears:    Normal TM's and external ear canals, both ears  Nose:   Nares normal, septum midline, mucosa normal, no drainage    or sinus tenderness  Throat:   Lips, mucosa, and tongue normal; teeth and gums normal  Neck:   Supple, symmetrical, trachea midline, no adenopathy;    Thyroid: no enlargement/tenderness/nodules  Back:     Symmetric, no curvature, ROM normal, no CVA tenderness  Lungs:     Clear to auscultation bilaterally, respirations unlabored  Chest Wall:    No tenderness or deformity   Heart:    Regular rate and rhythm, S1 and S2 normal, no murmur, rub   or gallop  Breast Exam:    Deferred to GYN  Abdomen:     Soft, non-tender, bowel sounds active all four quadrants,    no masses, no organomegaly  Genitalia:    Deferred to GYN  Rectal:    Extremities:   Extremities normal, atraumatic, no cyanosis or  edema  Pulses:   2+ and symmetric all extremities  Skin:   Skin color, texture, turgor normal, no rashes or lesions  Lymph nodes:   Cervical, supraclavicular, and axillary nodes normal  Neurologic:   CNII-XII intact, normal strength, sensation and reflexes    throughout          Assessment & Plan:

## 2014-12-08 ENCOUNTER — Other Ambulatory Visit: Payer: Self-pay | Admitting: Family Medicine

## 2015-01-06 ENCOUNTER — Other Ambulatory Visit: Payer: Self-pay | Admitting: General Practice

## 2015-01-06 MED ORDER — VITAMIN D (ERGOCALCIFEROL) 1.25 MG (50000 UNIT) PO CAPS: 50000.0000 [IU] | ORAL_CAPSULE | ORAL | Status: DC

## 2015-01-30 ENCOUNTER — Other Ambulatory Visit: Payer: Self-pay | Admitting: Family Medicine

## 2015-01-31 NOTE — Telephone Encounter (Signed)
Medication filled to pharmacy as requested.   

## 2015-02-07 ENCOUNTER — Telehealth: Payer: Self-pay | Admitting: Family Medicine

## 2015-02-07 NOTE — Telephone Encounter (Signed)
Spoke with pt who advised that she is having pain in her hips for a day or two off and on. She states that this is happening directly after taking her vitamin D. Supplement. Please advise is this is a normal side effect? She states that it appears to be lessening, but she is wondering if she should get the last 4 weeks filled.

## 2015-02-07 NOTE — Telephone Encounter (Signed)
Pt states she having some joint pain and thinks it is related to the high dose of Vit D. Requesting call.

## 2015-02-07 NOTE — Telephone Encounter (Signed)
You can have joint pain related to Vit D intake.  She can hold off on the last 4 weeks and take 5000 units OTC daily

## 2015-02-08 NOTE — Telephone Encounter (Signed)
Called and informed pt. She stated an understanding.

## 2015-02-14 ENCOUNTER — Other Ambulatory Visit: Payer: Self-pay | Admitting: General Practice

## 2015-02-14 MED ORDER — FLUOXETINE HCL 20 MG PO CAPS: ORAL_CAPSULE | ORAL | Status: DC

## 2015-03-07 ENCOUNTER — Encounter: Payer: Self-pay | Admitting: Family Medicine

## 2015-03-07 ENCOUNTER — Ambulatory Visit (INDEPENDENT_AMBULATORY_CARE_PROVIDER_SITE_OTHER): Payer: 59 | Admitting: Family Medicine

## 2015-03-07 VITALS — BP 106/80 | HR 78 | Temp 98.0°F | Resp 16 | Ht 64.0 in | Wt 101.4 lb

## 2015-03-07 DIAGNOSIS — E559 Vitamin D deficiency, unspecified: Secondary | ICD-10-CM | POA: Diagnosis not present

## 2015-03-07 DIAGNOSIS — B373 Candidiasis of vulva and vagina: Secondary | ICD-10-CM | POA: Diagnosis not present

## 2015-03-07 DIAGNOSIS — B3731 Acute candidiasis of vulva and vagina: Secondary | ICD-10-CM

## 2015-03-07 LAB — VITAMIN D 25 HYDROXY (VIT D DEFICIENCY, FRACTURES): VITD: 50.62 ng/mL (ref 30.00–100.00)

## 2015-03-07 MED ORDER — FLUTICASONE-SALMETEROL 250-50 MCG/DOSE IN AEPB: 1.0000 | INHALATION_SPRAY | Freq: Two times a day (BID) | RESPIRATORY_TRACT | Status: DC

## 2015-03-07 MED ORDER — FLUCONAZOLE 150 MG PO TABS: ORAL_TABLET | ORAL | Status: DC

## 2015-03-07 NOTE — Assessment & Plan Note (Signed)
Recurrent issue for pt.  Not clear on her systemic yeast sxs but her vaginal sxs are treatable w/ UpToDate recommended course of diflucan.  Reviewed supportive care and red flags that should prompt return.  Pt expressed understanding and is in agreement w/ plan.

## 2015-03-07 NOTE — Progress Notes (Signed)
   Subjective:    Patient ID: Jasmine Herrera, female    DOB: 23-Feb-1966, 49 y.o.   MRN: [id number]  HPI Vit D deficiency- pt is asking for repeat levels since she completed 50,000 units weekly x12 weeks and taking 5,000 units daily.  'i think i have some weird yeast thing'- pt reports recurring vaginal yeast infections, outbreaks around her mouth- 'the only thing that helps is lip balm w/ tea tree oil in it'.  Pt reports all sxs improved when she started Oregano essential oil.  'i'm better when I'm taking probiotics and eating less sugar'.  Pt reports monthly vaginal yeast infections.  OTC medications improved her sxs last week.   Review of Systems For ROS see HPI     Objective:   Physical Exam  Constitutional: She is oriented to person, place, and time. She appears well-developed and well-nourished. No distress.  HENT:  Head: Normocephalic and atraumatic.  Eyes: EOM are normal. Pupils are equal, round, and reactive to light.  Genitourinary:  Pt declined  Neurological: She is alert and oriented to person, place, and time.  Skin: Skin is warm and dry. No rash noted.  Psychiatric: She has a normal mood and affect. Her behavior is normal.  Vitals reviewed.         Assessment & Plan:

## 2015-03-07 NOTE — Patient Instructions (Signed)
Follow up as needed We'll notify you of your lab results and make any changes if needed Start the Diflucan as directed- one dose every 3 days x3 doses and then weekly x6 months If no improvement in your itching, please let me know so we can refer you to an allergist Be mindful of your sugar intake but no need for tight restrictions Call with any questions or concerns Hang in there!!!

## 2015-03-07 NOTE — Assessment & Plan Note (Signed)
New.  Noted on last labs.  Completed 12 week course of meds.  Repeat level.

## 2015-03-07 NOTE — Progress Notes (Signed)
Pre visit review using our clinic review tool, if applicable. No additional management support is needed unless otherwise documented below in the visit note. 

## 2015-03-08 ENCOUNTER — Encounter: Payer: Self-pay | Admitting: General Practice

## 2015-03-09 ENCOUNTER — Encounter: Payer: 59 | Admitting: Gastroenterology

## 2015-03-21 ENCOUNTER — Ambulatory Visit: Payer: 59 | Admitting: Family Medicine

## 2015-03-27 ENCOUNTER — Ambulatory Visit (AMBULATORY_SURGERY_CENTER): Payer: Self-pay | Admitting: *Deleted

## 2015-03-27 VITALS — Ht 64.0 in | Wt 102.0 lb

## 2015-03-27 DIAGNOSIS — Z1211 Encounter for screening for malignant neoplasm of colon: Secondary | ICD-10-CM

## 2015-03-27 NOTE — Progress Notes (Signed)
Patient denies any allergies to egg or soy products. Patient denies complications with anesthesia/sedation.  Patient denies oxygen use at home and denies diet medications. Emmi instructions for colonoscopy explained and given to patient.  

## 2015-03-28 ENCOUNTER — Encounter: Payer: Self-pay | Admitting: Family Medicine

## 2015-03-28 ENCOUNTER — Ambulatory Visit: Payer: 59 | Admitting: Family Medicine

## 2015-03-28 ENCOUNTER — Ambulatory Visit (INDEPENDENT_AMBULATORY_CARE_PROVIDER_SITE_OTHER): Payer: 59 | Admitting: Family Medicine

## 2015-03-28 VITALS — BP 118/78 | HR 74 | Temp 98.0°F | Resp 16 | Wt 102.0 lb

## 2015-03-28 DIAGNOSIS — F32A Depression, unspecified: Secondary | ICD-10-CM

## 2015-03-28 DIAGNOSIS — F329 Major depressive disorder, single episode, unspecified: Secondary | ICD-10-CM | POA: Diagnosis not present

## 2015-03-28 MED ORDER — ALPRAZOLAM 0.25 MG PO TABS: 0.2500 mg | ORAL_TABLET | Freq: Two times a day (BID) | ORAL | Status: DC | PRN

## 2015-03-28 NOTE — Assessment & Plan Note (Signed)
Chronic problem.  Pt reports she is doing ok on Prozac but continues to have some anxiety- particularly w/ election results last week.  Pt found herself using 51 yr old xanax last week.  Rather than use meds that are no longer effective, new script provided.  Will continue to follow.

## 2015-03-28 NOTE — Patient Instructions (Signed)
Schedule your complete physical for July Continue Prozac daily Use the alprazolam for those panicked moments and only as needed Call with any questions or concerns If you want to join [place] at the new [place] office, any scheduled appointments will automatically transfer and we will see you at [address] [place] [address] [place], [place] [address] ([place] 05/16/15) [place] in there! Happy Holidays!!!

## 2015-03-28 NOTE — Progress Notes (Signed)
Pre visit review using our clinic review tool, if applicable. No additional management support is needed unless otherwise documented below in the visit note. 

## 2015-03-28 NOTE — Progress Notes (Signed)
   Subjective:    Patient ID: [name], female    DOB: 06-03-1965, 49 y.o.   MRN: [id number]  HPI Depression- chronic problem, on Prozac.  Pt was tearful and very anxious at CPE in July.  Pt has sinus surgery scheduled for December which has her worried but she is prepared.  Pt feels that mood is 'ok'.  Had a difficult time w/ election results last week.  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  Pt has time off in December b/c it's a 'seasonal position'.     Review of Systems For ROS see HPI     Objective:   Physical Exam  Constitutional: She is oriented to person, place, and time. She appears well-developed and well-nourished. No distress.  HENT:  Head: Normocephalic and atraumatic.  Eyes: Conjunctivae and EOM are normal. Pupils are equal, round, and reactive to light.  Neurological: She is alert and oriented to person, place, and time.  Skin: Skin is warm and dry.  Psychiatric: Her behavior is normal. Thought content normal.  Inappropriate laughter throughout visit  Vitals reviewed.         Assessment & Plan:

## 2015-03-30 ENCOUNTER — Encounter: Payer: Self-pay | Admitting: Gastroenterology

## 2015-04-05 ENCOUNTER — Telehealth: Payer: Self-pay | Admitting: Family Medicine

## 2015-04-05 MED ORDER — ALPRAZOLAM 0.25 MG PO TABS: 0.2500 mg | ORAL_TABLET | Freq: Two times a day (BID) | ORAL | Status: DC | PRN

## 2015-04-05 NOTE — Telephone Encounter (Signed)
Caller name: Amy with [facility] in [place], [place] - [place] [phone] (Phone) [phone] (Fax)        Reason for call: states pt has told her she called [place] a couple time to have xanax filled and we told her it was sent 03/28/15. Per Amy they did not receive. I confirmed ph# and fax# for HT above.

## 2015-04-05 NOTE — Telephone Encounter (Signed)
Ok to re-send?  °

## 2015-04-05 NOTE — Telephone Encounter (Signed)
Noted, did not resend to pharmacy.

## 2015-04-05 NOTE — Telephone Encounter (Signed)
Please advise pt states that she cannot find the prescription at home and even looked through car while I was on the phone with her.

## 2015-04-05 NOTE — Telephone Encounter (Signed)
Medication filled to pharmacy as requested.   

## 2015-04-05 NOTE — Telephone Encounter (Signed)
Pt just called. She found it!

## 2015-04-10 ENCOUNTER — Ambulatory Visit (AMBULATORY_SURGERY_CENTER): Payer: 59 | Admitting: Gastroenterology

## 2015-04-10 ENCOUNTER — Encounter: Payer: Self-pay | Admitting: Gastroenterology

## 2015-04-10 VITALS — BP 139/82 | HR 78 | Temp 97.9°F | Resp 13 | Ht 64.0 in | Wt 102.0 lb

## 2015-04-10 DIAGNOSIS — Z1211 Encounter for screening for malignant neoplasm of colon: Secondary | ICD-10-CM | POA: Diagnosis present

## 2015-04-10 DIAGNOSIS — D120 Benign neoplasm of cecum: Secondary | ICD-10-CM

## 2015-04-10 DIAGNOSIS — D123 Benign neoplasm of transverse colon: Secondary | ICD-10-CM | POA: Diagnosis not present

## 2015-04-10 DIAGNOSIS — K635 Polyp of colon: Secondary | ICD-10-CM | POA: Diagnosis not present

## 2015-04-10 MED ORDER — SODIUM CHLORIDE 0.9 % IV SOLN: 500.0000 mL | INTRAVENOUS | Status: DC

## 2015-04-10 NOTE — Progress Notes (Signed)
Report to PACU, RN, vss, BBS= Clear.  

## 2015-04-10 NOTE — Op Note (Signed)
[place]  [address]. [place], [address]   COLONOSCOPY PROCEDURE REPORT  PATIENT: Jasmine Herrera, Jasmine Herrera  MR#: [id number] BIRTHDATE: 08-17-1965 , 48  yrs. old GENDER: female ENDOSCOPIST: Ladene Artist, MD, [facility] Regal REFERRED BY: Midge Minium, M.D. PROCEDURE DATE:  04/10/2015 PROCEDURE:   Colonoscopy, screening, Colonoscopy with biopsy, and Colonoscopy with snare polypectomy First Screening Colonoscopy - Avg.  risk and is 50 yrs.  old or older Yes.  Prior Negative Screening - Now for repeat screening. N/A  History of Adenoma - Now for follow-up colonoscopy & has been > or = to 3 yrs.  N/A  Polyps removed today? Yes ASA CLASS:   Class II INDICATIONS:Screening for colonic neoplasia and Colorectal Neoplasm Risk Assessment for this procedure is average risk. MEDICATIONS: Monitored anesthesia care and Propofol 220 mg IV DESCRIPTION OF PROCEDURE:   After the risks benefits and alternatives of the procedure were thoroughly explained, informed consent was obtained.  The digital rectal exam revealed no abnormalities of the rectum.   The LB PFC-H190 [id number]  endoscope was introduced through the anus and advanced to the cecum, which was identified by both the appendix and ileocecal valve. No adverse events experienced.   The quality of the prep was good.  (MiraLax was used)  The instrument was then slowly withdrawn as the colon was fully examined. Estimated blood loss is zero unless otherwise noted in this procedure report.    COLON FINDINGS: A sessile polyp measuring 5 mm in size was found at the cecum.  A polypectomy was performed with cold forceps.  The resection was complete, the polyp tissue was completely retrieved and sent to histology.   A sessile polyp measuring 6 mm in size was found in the transverse colon.  A polypectomy was performed with a cold snare.  The resection was complete, the polyp tissue was completely retrieved and sent to histology.   There was  mild diverticulosis noted in the sigmoid colon and transverse colon. The examination was otherwise normal.  Retroflexed views revealed no abnormalities. The time to cecum = 2.0 Withdrawal time = 10.9 The scope was withdrawn and the procedure completed. COMPLICATIONS: There were no immediate complications.  ENDOSCOPIC IMPRESSION: 1.   Sessile polyp at the cecum; polypectomy performed with cold forceps 2.   Sessile polyp in the transverse colon; polypectomy performed with a cold snare 3.   Mild diverticulosis in the sigmoid colon and transverse colon  RECOMMENDATIONS: 1.  Await pathology results 2.  Repeat colonoscopy in 5 years if polyp(s) adenomatous; otherwise 10 years  eSigned:  Ladene Artist, MD, [facility] 04/10/2015 8:37 AM

## 2015-04-10 NOTE — Patient Instructions (Signed)
Impressions/recommendations:  Polyps (handout given) Diverticulosis (handout given) High Fiber diet (handout given)  YOU HAD AN ENDOSCOPIC PROCEDURE TODAY AT THE [facility]:   Refer to the procedure report that was given to you for any specific questions about what was found during the examination.  If the procedure report does not answer your questions, please call your gastroenterologist to clarify.  If you requested that your care partner not be given the details of your procedure findings, then the procedure report has been included in a sealed envelope for you to review at your convenience later.  YOU SHOULD EXPECT: Some feelings of bloating in the abdomen. Passage of more gas than usual.  Walking can help get rid of the air that was put into your GI tract during the procedure and reduce the bloating. If you had a lower endoscopy (such as a colonoscopy or flexible sigmoidoscopy) you may notice spotting of blood in your stool or on the toilet paper. If you underwent a bowel prep for your procedure, you may not have a normal bowel movement for a few days.  Please Note:  You might notice some irritation and congestion in your nose or some drainage.  This is from the oxygen used during your procedure.  There is no need for concern and it should clear up in a day or so.  SYMPTOMS TO REPORT IMMEDIATELY:   Following lower endoscopy (colonoscopy or flexible sigmoidoscopy):  Excessive amounts of blood in the stool  Significant tenderness or worsening of abdominal pains  Swelling of the abdomen that is new, acute  Fever of 100F or higher  For urgent or emergent issues, a gastroenterologist can be reached at any hour by calling [phone].   DIET: Your first meal following the procedure should be a small meal and then it is ok to progress to your normal diet. Heavy or fried foods are harder to digest and may make you feel nauseous or bloated.  Likewise, meals heavy in dairy and  vegetables can increase bloating.  Drink plenty of fluids but you should avoid alcoholic beverages for 24 hours.  ACTIVITY:  You should plan to take it easy for the rest of today and you should NOT DRIVE or use heavy machinery until tomorrow (because of the sedation medicines used during the test).    FOLLOW UP: Our staff will call the number listed on your records the next business day following your procedure to check on you and address any questions or concerns that you may have regarding the information given to you following your procedure. If we do not reach you, we will leave a message.  However, if you are feeling well and you are not experiencing any problems, there is no need to return our call.  We will assume that you have returned to your regular daily activities without incident.  If any biopsies were taken you will be contacted by phone or by letter within the next 1-3 weeks.  Please call us at [phone] if you have not heard about the biopsies in 3 weeks.    SIGNATURES/CONFIDENTIALITY: You and/or your care partner have signed paperwork which will be entered into your electronic medical record.  These signatures attest to the fact that that the information above on your After Visit Summary has been reviewed and is understood.  Full responsibility of the confidentiality of this discharge information lies with you and/or your care-partner. 

## 2015-04-10 NOTE — Progress Notes (Signed)
Called to room to assist during endoscopic procedure.  Patient ID and intended procedure confirmed with present staff. Received instructions for my participation in the procedure from the performing physician.  

## 2015-04-11 ENCOUNTER — Telehealth: Payer: Self-pay | Admitting: *Deleted

## 2015-04-11 NOTE — Telephone Encounter (Signed)
  Follow up Call-  Call back number 04/10/2015  Post procedure Call Back phone  # [phone]  Permission to leave phone message Yes   [facility]

## 2015-04-12 ENCOUNTER — Encounter (HOSPITAL_BASED_OUTPATIENT_CLINIC_OR_DEPARTMENT_OTHER): Payer: Self-pay | Admitting: *Deleted

## 2015-04-12 ENCOUNTER — Other Ambulatory Visit: Payer: Self-pay | Admitting: Otolaryngology

## 2015-04-12 NOTE — H&P (Signed)
Jasmine Herrera, [name] 49 y.o., female [id number]     Chief Complaint: LEFT antrochoanal polyp.  Deviated nasal septum.  Hypertrophic nasal turbinates  HPI: 49 year old white female comes in for evaluation of a possible nasal polyp.  She has numerous allergies and has taken shots in the past.  She also uses sublingual allergy drops.  Roughly 10 years ago she saw an otolaryngologist who diagnosed her with nasal polyps.  She did not follow up on this.  She does have frequent migraine headaches and wonders if she may be having sinus triggers.  She has not had any imaging throughout.  The nose breathes fair on both sides.  Presently, no facial pain or colorful drainage.  She does not smoke.   She describes some itching in her ears and also around her lips which she assumes may be allergic.  Occasionally the ear canals feel crusted but not actually moist.  She does use Q-tips regularly.  Hearing is fine.  3 months to recheck for this 49 year old white female.  We are planning to perform septoplasty and reduction of turbinates, and endoscopic excision of a LEFT antrochoanal polyp.  I discussed the surgery in detail including risks and complications.  Questions were answered and informed consent was obtained.   I discussed advancement of diet activity.  Nasal hygiene instructions were given.  I will see her one day after surgery to remove packs, and again 10 days afterwards to remove septal splints.  She has some chronic issue with presumed Candida vaginitis.  She is taking Diflucan once weekly.  She is concerned that perhaps the nasal surgery will be a trigger for her migraines.   I gave her prescriptions for hydrocodone for pain relief, and cephalexin both for postop use.  PMH: Past Medical History  Diagnosis Date  . Migraine   . Allergy   . Asthma   . Depression   . Osteoporosis     no meds  . Anxiety     Surg Hx: Past Surgical History  Procedure Laterality Date  . Fracture surgery  1983     left femur  . Tonsillectomy and adenoidectomy    . Wisdom tooth extraction      FHx:   Family History  Problem Relation Age of Onset  . Adopted: Yes  . Family history unknown: Yes   SocHx:  reports that she has never smoked. She has never used smokeless tobacco. She reports that she drinks about 0.6 oz of alcohol per week. She reports that she does not use illicit drugs.  ALLERGIES:  Allergies  Allergen Reactions  . Penicillins Other (See Comments)    Unsure reaction as infant     (Not in a hospital admission)  No results found for this or any previous visit (from the past 48 hour(s)). No results found.  Jasmine Herrera:[id number]: Feeling tired (fatigue).  No fever, no night sweats, and no recent weight loss. Head: Headache. Eyes: No eye symptoms. Otolaryngeal: No hearing loss, no earache, no tinnitus, and no purulent nasal discharge.  Nasal passage blockage (stuffiness), snoring, and sneezing.  No hoarseness  and no sore throat. Cardiovascular: No chest pain or discomfort  and no palpitations. Pulmonary: Dyspnea.  No cough.  Wheezing. Gastrointestinal: No dysphagia  and no heartburn.  No nausea, no abdominal pain, and no melena.  No diarrhea. Genitourinary: No dysuria. Endocrine: No muscle weakness. Musculoskeletal: No calf muscle cramps, no arthralgias, and no soft tissue swelling. Neurological: No dizziness, no fainting, no tingling, and no numbness. Psychological:  No anxiety.  Depression. Skin: No rash.  Last menstrual period 03/14/2015.   BP:105/73,  HR: 79 b/min,  BSA Calculated: 1.49 ,  BMI Calculated: 17.58 ,  Weight: 104 lb , BMI: 17.6 kg/m2,  Height: 5 ft 4.5 in.  PHYSICAL EXAM: She is thin and healthy appearing.  Mental status is appropriate.  She hears well in conversational speech.  Voice is clear and respirations unlabored through the nose.  The head is atraumatic and neck supple.  Cranial nerves intact.  Both ear canals are overly clean but with no moisture.  Drums  are normal and aerated.  Anterior nose shows a severe rightward septal deviation with a prominent chondroethmoid spur.  There is a polypoid lesion coming from the LEFT middle meatus and going posteriorly on the LEFT side.  Oral cavity is clear with teeth in good repair.  Oral pharynx clear.  Neck without adenopathy.  Lungs: Clear to auscultation Heart: Regular rate and rhythm without murmurs Abdomen: Soft, active Extremities: Normal configuration Neurologic: Symmetric, grossly intact.  Studies Reviewed:Noncontrast CT scan of the paranasal sinuses shows a severe rightward septal deviation with a chondroethmoid spur abutting the lateral wall of the nose.  There are polypoid changes in the LEFT middle meatus which seem to emanate from the middle turbinate.  There is very slight thickening in the floor of the antrum on each side.  The ostiomeatal complex is slightly narrow but patent on both sides.    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 Headache, migraine (346.90) (G43.909). Deviated nasal septum (470) (J34.2). Hypertrophy of nasal turbinates (478.0) (J34.3).  We are going to straighten your nasal septum, reduce the turbinates, and removed a large LEFT nasal polyp.  This will take less than 2 hours.  You can go home later the same day.  I am leaving new prescriptions today for antibiotics for afterwards, and also pain medication.  If you need more Diflucan, let me know.    after we have removed the nasal packs, I would like you to use the nasal hygiene measures liberally.  Return visit here one day and 10 days postop please.  No strenuous activities for 2 weeks.  Cephalexin 250 MG Oral Capsule;TAKE 1 CAPSULE 4 TIMES DAILY; Qty40; R0; Rx. Hydrocodone-Acetaminophen 5-325 MG Oral Tablet;1-2 po q4-6h prn pain; 123XX123; R0; Rx  Jasmine Herrera [id number], 5:19 PM

## 2015-04-17 ENCOUNTER — Ambulatory Visit (HOSPITAL_BASED_OUTPATIENT_CLINIC_OR_DEPARTMENT_OTHER): Payer: 59 | Admitting: Anesthesiology

## 2015-04-17 ENCOUNTER — Ambulatory Visit (HOSPITAL_BASED_OUTPATIENT_CLINIC_OR_DEPARTMENT_OTHER)
Admission: RE | Admit: 2015-04-17 | Discharge: 2015-04-17 | Disposition: A | Discharge status: Home / Self Care | Payer: 59 | Source: Ambulatory Visit | Attending: Otolaryngology | Admitting: Otolaryngology

## 2015-04-17 ENCOUNTER — Encounter: Payer: Self-pay | Admitting: Gastroenterology

## 2015-04-17 ENCOUNTER — Encounter (HOSPITAL_BASED_OUTPATIENT_CLINIC_OR_DEPARTMENT_OTHER): Payer: Self-pay | Admitting: Anesthesiology

## 2015-04-17 ENCOUNTER — Encounter (HOSPITAL_BASED_OUTPATIENT_CLINIC_OR_DEPARTMENT_OTHER)
Admission: RE | Disposition: A | Discharge status: Home / Self Care | Payer: Self-pay | Source: Ambulatory Visit | Attending: Otolaryngology

## 2015-04-17 DIAGNOSIS — J343 Hypertrophy of nasal turbinates: Secondary | ICD-10-CM | POA: Diagnosis not present

## 2015-04-17 DIAGNOSIS — J338 Other polyp of sinus: Secondary | ICD-10-CM | POA: Diagnosis not present

## 2015-04-17 DIAGNOSIS — J342 Deviated nasal septum: Secondary | ICD-10-CM | POA: Insufficient documentation

## 2015-04-17 DIAGNOSIS — Z880 Allergy status to penicillin: Secondary | ICD-10-CM | POA: Diagnosis not present

## 2015-04-17 HISTORY — DX: Anxiety disorder, unspecified: F41.9

## 2015-04-17 HISTORY — PX: NASAL SEPTOPLASTY W/ TURBINOPLASTY: SHX2070

## 2015-04-17 HISTORY — PX: NASAL SINUS SURGERY: SHX719

## 2015-04-17 SURGERY — SINUS SURGERY, ENDOSCOPIC
Anesthesia: General | Site: Nose | Laterality: Left

## 2015-04-17 MED ORDER — MIDAZOLAM HCL 2 MG/2ML IJ SOLN
1.0000 mg | INTRAMUSCULAR | Status: DC | PRN
  Administered 2015-04-17: 2 mg via INTRAVENOUS

## 2015-04-17 MED ORDER — ATROPINE SULFATE 0.4 MG/ML IJ SOLN
INTRAMUSCULAR | Status: AC
  Filled 2015-04-17: qty 1

## 2015-04-17 MED ORDER — OXYMETAZOLINE HCL 0.05 % NA SOLN
NASAL | Status: DC | PRN
  Administered 2015-04-17: 1

## 2015-04-17 MED ORDER — SCOPOLAMINE 1 MG/3DAYS TD PT72: 1.0000 | MEDICATED_PATCH | Freq: Once | TRANSDERMAL | Status: DC | PRN

## 2015-04-17 MED ORDER — ONDANSETRON HCL 4 MG/2ML IJ SOLN
INTRAMUSCULAR | Status: AC
  Filled 2015-04-17: qty 2

## 2015-04-17 MED ORDER — OXYMETAZOLINE HCL 0.05 % NA SOLN
2.0000 | NASAL | Status: AC | PRN
  Administered 2015-04-17 (×2): 2 via NASAL

## 2015-04-17 MED ORDER — SUCCINYLCHOLINE CHLORIDE 20 MG/ML IJ SOLN
INTRAMUSCULAR | Status: AC
  Filled 2015-04-17: qty 1

## 2015-04-17 MED ORDER — HYDROCODONE-ACETAMINOPHEN 5-325 MG PO TABS: 1.0000 | ORAL_TABLET | ORAL | Status: DC | PRN

## 2015-04-17 MED ORDER — GLYCOPYRROLATE 0.2 MG/ML IJ SOLN: 0.2000 mg | Freq: Once | INTRAMUSCULAR | Status: DC | PRN

## 2015-04-17 MED ORDER — SUFENTANIL CITRATE 50 MCG/ML IV SOLN
INTRAVENOUS | Status: DC | PRN
  Administered 2015-04-17 (×2): 5 ug via INTRAVENOUS

## 2015-04-17 MED ORDER — DEXTROSE-NACL 5-0.45 % IV SOLN: INTRAVENOUS | Status: DC

## 2015-04-17 MED ORDER — SUCCINYLCHOLINE CHLORIDE 20 MG/ML IJ SOLN
INTRAMUSCULAR | Status: DC | PRN
  Administered 2015-04-17: 100 mg via INTRAVENOUS

## 2015-04-17 MED ORDER — LACTATED RINGERS IV SOLN: 500.0000 mL | INTRAVENOUS | Status: DC

## 2015-04-17 MED ORDER — PROPOFOL 500 MG/50ML IV EMUL
INTRAVENOUS | Status: AC
  Filled 2015-04-17: qty 50

## 2015-04-17 MED ORDER — IBUPROFEN 100 MG/5ML PO SUSP: 400.0000 mg | Freq: Four times a day (QID) | ORAL | Status: DC | PRN

## 2015-04-17 MED ORDER — LIDOCAINE-EPINEPHRINE 1 %-1:100000 IJ SOLN
INTRAMUSCULAR | Status: DC | PRN
  Administered 2015-04-17: 16 mL

## 2015-04-17 MED ORDER — OXYMETAZOLINE HCL 0.05 % NA SOLN
NASAL | Status: AC
  Filled 2015-04-17: qty 15

## 2015-04-17 MED ORDER — OXYCODONE HCL 5 MG/5ML PO SOLN: 5.0000 mg | Freq: Once | ORAL | Status: DC | PRN

## 2015-04-17 MED ORDER — PROPOFOL 10 MG/ML IV BOLUS
INTRAVENOUS | Status: DC | PRN
  Administered 2015-04-17: 150 mg via INTRAVENOUS

## 2015-04-17 MED ORDER — ONDANSETRON HCL 4 MG/2ML IJ SOLN: 4.0000 mg | Freq: Four times a day (QID) | INTRAMUSCULAR | Status: DC | PRN

## 2015-04-17 MED ORDER — DEXAMETHASONE SODIUM PHOSPHATE 10 MG/ML IJ SOLN
INTRAMUSCULAR | Status: AC
  Filled 2015-04-17: qty 1

## 2015-04-17 MED ORDER — CEFAZOLIN SODIUM-DEXTROSE 2-3 GM-% IV SOLR
INTRAVENOUS | Status: AC
  Filled 2015-04-17: qty 50

## 2015-04-17 MED ORDER — HYDROMORPHONE HCL 1 MG/ML IJ SOLN
INTRAMUSCULAR | Status: AC
  Filled 2015-04-17: qty 1

## 2015-04-17 MED ORDER — ONDANSETRON HCL 4 MG/2ML IJ SOLN
INTRAMUSCULAR | Status: DC | PRN
  Administered 2015-04-17: 4 mg via INTRAVENOUS

## 2015-04-17 MED ORDER — EPHEDRINE SULFATE 50 MG/ML IJ SOLN
INTRAMUSCULAR | Status: DC | PRN
  Administered 2015-04-17 (×2): 10 mg via INTRAVENOUS

## 2015-04-17 MED ORDER — MIDAZOLAM HCL 2 MG/2ML IJ SOLN
INTRAMUSCULAR | Status: AC
  Filled 2015-04-17: qty 2

## 2015-04-17 MED ORDER — SUFENTANIL CITRATE 50 MCG/ML IV SOLN
INTRAVENOUS | Status: AC
  Filled 2015-04-17: qty 1

## 2015-04-17 MED ORDER — LIDOCAINE HCL (CARDIAC) 20 MG/ML IV SOLN
INTRAVENOUS | Status: DC | PRN
  Administered 2015-04-17: 50 mg via INTRAVENOUS

## 2015-04-17 MED ORDER — DEXAMETHASONE SODIUM PHOSPHATE 4 MG/ML IJ SOLN
INTRAMUSCULAR | Status: DC | PRN
  Administered 2015-04-17: 10 mg via INTRAVENOUS

## 2015-04-17 MED ORDER — ONDANSETRON HCL 4 MG PO TABS: 4.0000 mg | ORAL_TABLET | ORAL | Status: DC | PRN

## 2015-04-17 MED ORDER — BACITRACIN ZINC 500 UNIT/GM EX OINT
TOPICAL_OINTMENT | CUTANEOUS | Status: DC | PRN
  Administered 2015-04-17: 1 via TOPICAL

## 2015-04-17 MED ORDER — LIDOCAINE HCL (CARDIAC) 20 MG/ML IV SOLN
INTRAVENOUS | Status: AC
  Filled 2015-04-17: qty 5

## 2015-04-17 MED ORDER — BACITRACIN-NEOMYCIN-POLYMYXIN 400-5-5000 EX OINT
TOPICAL_OINTMENT | CUTANEOUS | Status: AC
  Filled 2015-04-17: qty 1

## 2015-04-17 MED ORDER — ONDANSETRON HCL 4 MG/2ML IJ SOLN: 4.0000 mg | INTRAMUSCULAR | Status: DC | PRN

## 2015-04-17 MED ORDER — LIDOCAINE-EPINEPHRINE 1 %-1:100000 IJ SOLN
INTRAMUSCULAR | Status: AC
  Filled 2015-04-17: qty 1

## 2015-04-17 MED ORDER — EPHEDRINE SULFATE 50 MG/ML IJ SOLN
INTRAMUSCULAR | Status: AC
  Filled 2015-04-17: qty 1

## 2015-04-17 MED ORDER — LACTATED RINGERS IV SOLN
INTRAVENOUS | Status: DC
  Administered 2015-04-17 (×2): via INTRAVENOUS

## 2015-04-17 MED ORDER — PHENYLEPHRINE HCL 10 MG/ML IJ SOLN
INTRAMUSCULAR | Status: DC | PRN
  Administered 2015-04-17: 40 ug via INTRAVENOUS

## 2015-04-17 MED ORDER — BACITRACIN ZINC 500 UNIT/GM EX OINT
TOPICAL_OINTMENT | CUTANEOUS | Status: AC
  Filled 2015-04-17: qty 28.35

## 2015-04-17 MED ORDER — OXYCODONE HCL 5 MG PO TABS: 5.0000 mg | ORAL_TABLET | Freq: Once | ORAL | Status: DC | PRN

## 2015-04-17 MED ORDER — CEFAZOLIN SODIUM-DEXTROSE 2-3 GM-% IV SOLR
2.0000 g | INTRAVENOUS | Status: AC
  Administered 2015-04-17: 2 g via INTRAVENOUS

## 2015-04-17 MED ORDER — FENTANYL CITRATE (PF) 100 MCG/2ML IJ SOLN: 50.0000 ug | INTRAMUSCULAR | Status: DC | PRN

## 2015-04-17 MED ORDER — HYDROMORPHONE HCL 1 MG/ML IJ SOLN
0.2500 mg | INTRAMUSCULAR | Status: DC | PRN
  Administered 2015-04-17 (×4): 0.5 mg via INTRAVENOUS

## 2015-04-17 SURGICAL SUPPLY — 58 items
AIRWAY NASO PHAR 26FR 6.5 (TUBING)
AIRWAY NASOPHAR 26 6.5 (TUBING) IMPLANT
ARWY NASO THNWL 26FR 6.5 (TUBING)
ATTRACTOMAT 16X20 MAGNETIC DRP (DRAPES) ×2 IMPLANT
BLADE RAD40 ROTATE 4M 4 5PK (BLADE) IMPLANT
BLADE RAD60 ROTATE M4 4 5PK (BLADE) IMPLANT
BLADE TRICUT ROTATE M4 4 5PK (BLADE) IMPLANT
BUR HS RAD FRONTAL 3 (BURR) IMPLANT
BUR SPHERICAL 2.9 (BURR) IMPLANT
CANISTER SUC SOCK COL 7IN (MISCELLANEOUS) ×3 IMPLANT
CANISTER SUCT 1200ML W/VALVE (MISCELLANEOUS) ×3 IMPLANT
COAGULATOR SUCT 8FR VV (MISCELLANEOUS) ×1 IMPLANT
DECANTER SPIKE VIAL GLASS SM (MISCELLANEOUS) IMPLANT
DEPRESSOR TONGUE BLADE STERILE (MISCELLANEOUS) ×6 IMPLANT
DRESSING NASAL KENNEDY 3.5X.9 (MISCELLANEOUS) IMPLANT
DRSG NASAL KENNEDY 3.5X.9 (MISCELLANEOUS)
DRSG NASOPORE 8CM (GAUZE/BANDAGES/DRESSINGS) IMPLANT
DRSG TELFA 3X8 NADH (GAUZE/BANDAGES/DRESSINGS) ×3 IMPLANT
ELECT REM PT RETURN 9FT ADLT (ELECTROSURGICAL) ×3
ELECTRODE REM PT RTRN 9FT ADLT (ELECTROSURGICAL) ×2 IMPLANT
GAUZE PACKING FOLDED 2  STR (GAUZE/BANDAGES/DRESSINGS) ×1
GAUZE PACKING FOLDED 2 STR (GAUZE/BANDAGES/DRESSINGS) ×2 IMPLANT
GLOVE ECLIPSE 8.0 STRL XLNG CF (GLOVE) ×6 IMPLANT
GLOVE SURG SS PI 7.0 STRL IVOR (GLOVE) ×1 IMPLANT
GOWN STRL REUS W/ TWL LRG LVL3 (GOWN DISPOSABLE) ×2 IMPLANT
GOWN STRL REUS W/ TWL XL LVL3 (GOWN DISPOSABLE) ×2 IMPLANT
GOWN STRL REUS W/TWL LRG LVL3 (GOWN DISPOSABLE) ×3
GOWN STRL REUS W/TWL XL LVL3 (GOWN DISPOSABLE) ×3
IV NS 500ML (IV SOLUTION) ×3
IV NS 500ML BAXH (IV SOLUTION) IMPLANT
IV NS IRRIG 3000ML ARTHROMATIC (IV SOLUTION) IMPLANT
NDL HYPO 25X1 1.5 SAFETY (NEEDLE) ×2 IMPLANT
NDL SPNL 25GX3.5 QUINCKE BL (NEEDLE) ×2 IMPLANT
NEEDLE HYPO 25X1 1.5 SAFETY (NEEDLE) ×3 IMPLANT
NEEDLE SPNL 25GX3.5 QUINCKE BL (NEEDLE) ×3 IMPLANT
NS IRRIG 1000ML POUR BTL (IV SOLUTION) ×1 IMPLANT
PACK BASIN DAY SURGERY FS (CUSTOM PROCEDURE TRAY) ×3 IMPLANT
PACK ENT DAY SURGERY (CUSTOM PROCEDURE TRAY) ×3 IMPLANT
PAD DRESSING TELFA 3X8 NADH (GAUZE/BANDAGES/DRESSINGS) IMPLANT
PATTIES SURGICAL .5 X3 (DISPOSABLE) ×3 IMPLANT
SHEATH ENDOSCRUB 0 DEG (SHEATH) IMPLANT
SHEATH ENDOSCRUB 30 DEG (SHEATH) IMPLANT
SLEEVE SCD COMPRESS KNEE MED (MISCELLANEOUS) ×1 IMPLANT
SOLUTION ANTI FOG 6CC (MISCELLANEOUS) ×3 IMPLANT
SPONGE GAUZE 2X2 8PLY STRL LF (GAUZE/BANDAGES/DRESSINGS) ×3 IMPLANT
SPONGE SURGIFOAM ABS GEL 12-7 (HEMOSTASIS) IMPLANT
SUT CHROMIC 3 0 PS 2 (SUTURE) IMPLANT
SUT CHROMIC 4 0 P 3 18 (SUTURE) ×3 IMPLANT
SUT ETHILON 3 0 PS 1 (SUTURE) ×3 IMPLANT
SUT PDS AB 4-0 P3 18 (SUTURE) ×3 IMPLANT
SUT PLAIN 4 0 ~~LOC~~ 1 (SUTURE) IMPLANT
SUT SILK 2 0 FS (SUTURE) IMPLANT
SUT VIC AB 3-0 FS2 27 (SUTURE) IMPLANT
SWAB CULTURE ESWAB REG 1ML (MISCELLANEOUS) IMPLANT
TOWEL OR 17X24 6PK STRL BLUE (TOWEL DISPOSABLE) ×5 IMPLANT
TRAY DSU PREP LF (CUSTOM PROCEDURE TRAY) ×3 IMPLANT
TUBE CONNECTING 20X1/4 (TUBING) ×1 IMPLANT
YANKAUER SUCT BULB TIP NO VENT (SUCTIONS) ×3 IMPLANT

## 2015-04-17 NOTE — Anesthesia Preprocedure Evaluation (Signed)
Anesthesia Evaluation  Patient identified by MRN, date of birth, ID band Patient awake    Reviewed: Allergy & Precautions, NPO status , Patient's Chart, lab work & pertinent test results  Airway Mallampati: II   Neck ROM: full    Dental   Pulmonary asthma ,    breath sounds clear to auscultation       Cardiovascular negative cardio ROS   Rhythm:regular Rate:Normal     Neuro/Psych  Headaches, Anxiety Depression  Neuromuscular disease    GI/Hepatic   Endo/Other    Renal/GU      Musculoskeletal   Abdominal   Peds  Hematology   Anesthesia Other Findings   Reproductive/Obstetrics                             Anesthesia Physical Anesthesia Plan  ASA: II  Anesthesia Plan: General   Post-op Pain Management:    Induction: Intravenous  Airway Management Planned: Oral ETT  Additional Equipment:   Intra-op Plan:   Post-operative Plan: Extubation in OR  Informed Consent: I have reviewed the patients History and Physical, chart, labs and discussed the procedure including the risks, benefits and alternatives for the proposed anesthesia with the patient or authorized representative who has indicated his/her understanding and acceptance.     Plan Discussed with: CRNA, Anesthesiologist and Surgeon  Anesthesia Plan Comments:         Anesthesia Quick Evaluation

## 2015-04-17 NOTE — Brief Op Note (Signed)
04/17/2015  8:55 AM  PATIENT:  Jasmine Herrera  49 y.o. female  PRE-OPERATIVE DIAGNOSIS:  left antrochoanal polyp, deviated nasal septum, hypertropic turbinate  POST-OPERATIVE DIAGNOSIS: right and left nasal  polyps, deviated nasal septum, hypertropic turbinate  PROCEDURE:  Endoscopic bilateral nasal polypectomies.  Nasal septoplasty. Bilateral submucous resection inferior turbinates  SURGEON:  Surgeon(s) and Role:    * Jodi Marble, MD - Primary   ANESTHESIA: GOT    EBL:  10 ml  LOCAL MEDICATIONS USED: Afrin solution  SPECIMEN:  Bilateral nasal polyps  DISPOSITION OF SPECIMEN:  PATHOLOGY  COUNTS:  YES   Findings: Polypoid changes along the left middle turbinate. Polypoid changes on the right in the posterior nose and sphenoethmoidal recess. Overall narrow nose with a prominent right chondral ethmoid spur. Mildly bulky inferior turbinates bilateral.  DICTATION: .Dragon Dictation   The patient received preoperative Afrin spray for topical decongestion.  In a comfortable supine position in the operating room, general orotracheal anesthesia was administered. At an appropriate level, a surgical timeout was performed in the standard fashion. Saline moistened throat pack was placed. Nasal vibrissae were trimmed. Afrin solution was applied on 0.5 x 3" cottonoids to both sides of the nasal septum. 1% Xylocaine with 1 100,000 epinephrine, 9 mL's total was infiltrated into the anterior floor of the nose, into the nasal spine region, into the membranous columella, and into the submucoperichondrial plane of the septum on both sides. Several minutes were allowed for this to take effect.  A sterile preparation and draping of the midface was accomplished in the standard fashion.  The materials were removed and the nose and observed be intact and correct in number. The left nose was inspected with a 0 nasal endoscope with the findings as described above.  Using the power debrider, polyps were  removed from the anterior pole of the middle turbinate and up against the lateral surface of the anterior middle turbinate. These were sent for specimen interpretation.  A left hemitransfixion incision was elected.  This was sharply executed and carried down to a small floor incision. A small right floor incision was executed. Floor tunnels were generated on both sides carried posteriorly brought medially to the vomer and brought forward along the maxillary crest. The submucoperichondrial plane of the left septum was elevated to the dorsum of the nose, carried back onto the perpendicular plate, and brought down to the floor and communicated with a floor tunnel and brought forward. The chondral ethmoid junction was identified and opened, and the submucoperichondrial plane of the right septum was elevated up to the dorsum and carried down to the heavy spurring.  An open Jansen-Middleton forceps was used to lyse the superior perpendicular plate to avoid rocking in the region of the cribriform plate. The mid perpendicular plate down to the vomer was rocked free with a closed KeySpan forceps and delivered. A cartilaginous tail of the quadrangular cartilage was dissected and delivered. The posterior inferior corner of the quadrangular cartilage was submucosally resected including a 2 mm strip along the caudal strut.  The maxillary crest posterior to the caudal strut was lowered with a mallet and osteotome. The septum was freed from the upper lateral cartilages sharply on both sides. Septum was easily brought into the midline was secured to the nasal spine with a figure-of-eight 4-0 PDS suture. Hemostasis was observed.  The mucosal incisions were closed with interrupted 4-0 chromic suture.  While performing the septoplasty, small polyps were noticed in the posterior nose on the  right side which were avulsed with a Takahashi forceps and sent as a separate specimen.  While completing the septoplasty, the  inferior turbinates were infiltrated with 1% Xylocaine with 1 100,000 epinephrine on a 25-gauge spinal needle, 6 mL total. Several minutes were allowed for this to take effect.  Beginning on the right side, the turbinate was infractured and the anterior hood was lysed just behind the nasal valve. The medial mucosa was incised in an anterior upsloping fashion and a laterally based flap was developed. The turbinate was further infractured, and using angled turbinate scissors, the turbinate bone and lateral mucosa was resected in a posterior downsloping fashion, taking all of the anterior pole and leaving all the posterior pole. Bony spicules were removed as identified. The flap was laid back down and the turbinate was outfractured. The bulbous posterior pole and cut mucosal edges were suction coagulated. Hemostasis was observed.  The left inferior turbinate was done in identical fashion.  At this point, a 0.040 reinforced Silastic splint was fashioned and placed against the septum on each side and secured thereto with a 3-0 nylon stitch. Triple thickness bacitracin impregnated Telfa packs were placed low in the nose on each side to support the septum and turbinates. Again hemostasis was observed.  The pharynx was suctioned clear and the throat pack was removed. Dental status was intact. Patient was returned to anesthesia, awakened, extubated, and transferred to recovery in satisfactory condition.    PLAN OF CARE: Discharge to home after PACU. Ice, elevation, antibiosis, analgesia. We will remove the nasal packs in one day and the septal splints in 10 days.  PATIENT DISPOSITION:  PACU - hemodynamically stable.   Delay start of Pharmacological VTE agent (>24hrs) due to surgical blood loss or risk of bleeding: yes

## 2015-04-17 NOTE — Anesthesia Procedure Notes (Signed)
Procedure Name: Intubation Date/Time: 04/17/2015 7:43 AM Performed by: Melynda Ripple D Pre-anesthesia Checklist: Patient identified, Emergency Drugs available, Suction available and Patient being monitored Patient Re-evaluated:Patient Re-evaluated prior to inductionOxygen Delivery Method: Circle System Utilized Preoxygenation: Pre-oxygenation with 100% oxygen Intubation Type: IV induction Ventilation: Mask ventilation without difficulty Laryngoscope Size: Mac and 3 Grade View: Grade II Tube type: Oral Tube size: 7.0 mm Number of attempts: 1 Airway Equipment and Method: Stylet and Oral airway Placement Confirmation: ETT inserted through vocal cords under direct vision,  positive ETCO2 and breath sounds checked- equal and bilateral Secured at: 23 cm Tube secured with: Tape Dental Injury: Teeth and Oropharynx as per pre-operative assessment

## 2015-04-17 NOTE — Transfer of Care (Signed)
Immediate Anesthesia Transfer of Care Note  Patient: Jasmine Herrera  Procedure(s) Performed: Procedure(s): ENDOSCOPIC REMOVAL ANTROCHOANAL POLYP (Left) NASAL SEPTOPLASTY WITH TURBINATE REDUCTION (N/A)  Patient Location: PACU  Anesthesia Type:General  Level of Consciousness: awake, alert  and oriented  Airway & Oxygen Therapy: Patient Spontanous Breathing and Patient connected to face mask oxygen  Post-op Assessment: Report given to RN and Post -op Vital signs reviewed and stable  Post vital signs: Reviewed and stable  Last Vitals:  Filed Vitals:   04/17/15 0718  BP: 115/84  Pulse: 75  Temp: 36.8 C  Resp: 18    Complications: No apparent anesthesia complications

## 2015-04-17 NOTE — H&P (View-Only) (Signed)
Jasmine Herrera, [place] 49 y.o., female [id number]     Chief Complaint: LEFT antrochoanal polyp.  Deviated nasal septum.  Hypertrophic nasal turbinates  HPI: 49 year old white female comes in for evaluation of a possible nasal polyp.  She has numerous allergies and has taken shots in the past.  She also uses sublingual allergy drops.  Roughly 10 years ago she saw an otolaryngologist who diagnosed her with nasal polyps.  She did not follow up on this.  She does have frequent migraine headaches and wonders if she may be having sinus triggers.  She has not had any imaging throughout.  The nose breathes fair on both sides.  Presently, no facial pain or colorful drainage.  She does not smoke.   She describes some itching in her ears and also around her lips which she assumes may be allergic.  Occasionally the ear canals feel crusted but not actually moist.  She does use Q-tips regularly.  Hearing is fine.  3 months to recheck for this 49 year old white female.  We are planning to perform septoplasty and reduction of turbinates, and endoscopic excision of a LEFT antrochoanal polyp.  I discussed the surgery in detail including risks and complications.  Questions were answered and informed consent was obtained.   I discussed advancement of diet activity.  Nasal hygiene instructions were given.  I will see her one day after surgery to remove packs, and again 10 days afterwards to remove septal splints.  She has some chronic issue with presumed Candida vaginitis.  She is taking Diflucan once weekly.  She is concerned that perhaps the nasal surgery will be a trigger for her migraines.   I gave her prescriptions for hydrocodone for pain relief, and cephalexin both for postop use.  PMH: Past Medical History  Diagnosis Date  . Migraine   . Allergy   . Asthma   . Depression   . Osteoporosis     no meds  . Anxiety     Surg Hx: Past Surgical History  Procedure Laterality Date  . Fracture surgery  1983     left femur  . Tonsillectomy and adenoidectomy    . Wisdom tooth extraction      FHx:   Family History  Problem Relation Age of Onset  . Adopted: Yes  . Family history unknown: Yes   SocHx:  reports that she has never smoked. She has never used smokeless tobacco. She reports that she drinks about 0.6 oz of alcohol per week. She reports that she does not use illicit drugs.  ALLERGIES:  Allergies  Allergen Reactions  . Penicillins Other (See Comments)    Unsure reaction as infant     (Not in a hospital admission)  No results found for this or any previous visit (from the past 48 hour(s)). No results found.  [id number]: Feeling tired (fatigue).  No fever, no night sweats, and no recent weight loss. Head: Headache. Eyes: No eye symptoms. Otolaryngeal: No hearing loss, no earache, no tinnitus, and no purulent nasal discharge.  Nasal passage blockage (stuffiness), snoring, and sneezing.  No hoarseness  and no sore throat. Cardiovascular: No chest pain or discomfort  and no palpitations. Pulmonary: Dyspnea.  No cough.  Wheezing. Gastrointestinal: No dysphagia  and no heartburn.  No nausea, no abdominal pain, and no melena.  No diarrhea. Genitourinary: No dysuria. Endocrine: No muscle weakness. Musculoskeletal: No calf muscle cramps, no arthralgias, and no soft tissue swelling. Neurological: No dizziness, no fainting, no tingling, and no numbness. Psychological:  No anxiety.  Depression. Skin: No rash.  Last menstrual period 03/14/2015.   BP:105/73,  HR: 79 b/min,  BSA Calculated: 1.49 ,  BMI Calculated: 17.58 ,  Weight: 104 lb , BMI: 17.6 kg/m2,  Height: 5 ft 4.5 in.  PHYSICAL EXAM: She is thin and healthy appearing.  Mental status is appropriate.  She hears well in conversational speech.  Voice is clear and respirations unlabored through the nose.  The head is atraumatic and neck supple.  Cranial nerves intact.  Both ear canals are overly clean but with no moisture.  Drums  are normal and aerated.  Anterior nose shows a severe rightward septal deviation with a prominent chondroethmoid spur.  There is a polypoid lesion coming from the LEFT middle meatus and going posteriorly on the LEFT side.  Oral cavity is clear with teeth in good repair.  Oral pharynx clear.  Neck without adenopathy.  Lungs: Clear to auscultation Heart: Regular rate and rhythm without murmurs Abdomen: Soft, active Extremities: Normal configuration Neurologic: Symmetric, grossly intact.  Studies Reviewed:Noncontrast CT scan of the paranasal sinuses shows a severe rightward septal deviation with a chondroethmoid spur abutting the lateral wall of the nose.  There are polypoid changes in the LEFT middle meatus which seem to emanate from the middle turbinate.  There is very slight thickening in the floor of the antrum on each side.  The ostiomeatal complex is slightly narrow but patent on both sides.    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 Headache, migraine (346.90) (G43.909). Deviated nasal septum (470) (J34.2). Hypertrophy of nasal turbinates (478.0) (J34.3).  We are going to straighten your nasal septum, reduce the turbinates, and removed a large LEFT nasal polyp.  This will take less than 2 hours.  You can go home later the same day.  I am leaving new prescriptions today for antibiotics for afterwards, and also pain medication.  If you need more Diflucan, let me know.    after we have removed the nasal packs, I would like you to use the nasal hygiene measures liberally.  Return visit here one day and 10 days postop please.  No strenuous activities for 2 weeks.  Cephalexin 250 MG Oral Capsule;TAKE 1 CAPSULE 4 TIMES DAILY; Qty40; R0; Rx. Hydrocodone-Acetaminophen 5-325 MG Oral Tablet;1-2 po q4-6h prn pain; 123XX123; R0; Rx  Jodi Marble [id number], 5:19 PM

## 2015-04-17 NOTE — Interval H&P Note (Signed)
History and Physical Interval Note:  04/17/2015 7:15 AM  Jasmine Herrera  has presented today for surgery, with the diagnosis of left antrochoanal polyp  deviated nasal septum hypertropic turbinate  The various methods of treatment have been discussed with the patient and family. After consideration of risks, benefits and other options for treatment, the patient has consented to  Procedure(s): ENDOSCOPIC REMOVAL ANTROCHOANAL POLYP (Left) NASAL SEPTOPLASTY WITH TURBINATE REDUCTION (N/A) as a surgical intervention .  The patient's history has been re-reviewed, patient re-examined, no change in status, stable for surgery.  I have re-reviewed the patient's chart and labs.  Questions were answered to the patient's satisfaction.     Jodi Marble

## 2015-04-17 NOTE — Anesthesia Postprocedure Evaluation (Signed)
Anesthesia Post Note  Patient: Jasmine Herrera  Procedure(s) Performed: Procedure(s) (LRB): ENDOSCOPIC REMOVAL ANTROCHOANAL POLYP (Left) NASAL SEPTOPLASTY WITH TURBINATE REDUCTION (Bilateral)  Patient location during evaluation: PACU Anesthesia Type: General Level of consciousness: awake and alert and patient cooperative Pain management: pain level controlled Vital Signs Assessment: post-procedure vital signs reviewed and stable Respiratory status: spontaneous breathing and respiratory function stable Cardiovascular status: stable Anesthetic complications: no    Last Vitals:  Filed Vitals:   04/17/15 0945 04/17/15 1000  BP: 109/61 130/79  Pulse: 113 108  Temp:    Resp: 8 9    Last Pain:  Filed Vitals:   04/17/15 1005  PainSc: [name]

## 2015-04-17 NOTE — Discharge Instructions (Signed)
Keep head elevated x 3-4 nights Drip pad as needed for 2-3 days Rinse throat with cool dilute salt water as often as desired to clear old blood and thick phlegm OK to shower starting tomorrow Recheck my office tomorrow for pack removal. Have someone drive you for this visit, and take a dose of pain medication before coming in. No strenuous activity x 2 weeks Call for active bleeding, any signs of infection, [id number] Begin nasal hygiene measures after we remove the packs tomorrow.    Post Anesthesia Home Care Instructions  Activity: Get plenty of rest for the remainder of the day. A responsible adult should stay with you for 24 hours following the procedure.  For the next 24 hours, DO NOT: -Drive a car -Paediatric nurse -Drink alcoholic beverages -Take any medication unless instructed by your physician -Make any legal decisions or sign important papers.  Meals: Start with liquid foods such as gelatin or soup. Progress to regular foods as tolerated. Avoid greasy, spicy, heavy foods. If nausea and/or vomiting occur, drink only clear liquids until the nausea and/or vomiting subsides. Call your physician if vomiting continues.  Special Instructions/Symptoms: Your throat may feel dry or sore from the anesthesia or the breathing tube placed in your throat during surgery. If this causes discomfort, gargle with warm salt water. The discomfort should disappear within 24 hours.  If you had a scopolamine patch placed behind your ear for the management of post- operative nausea and/or vomiting:  1. The medication in the patch is effective for 72 hours, after which it should be removed.  Wrap patch in a tissue and discard in the trash. Wash hands thoroughly with soap and water. 2. You may remove the patch earlier than 72 hours if you experience unpleasant side effects which may include dry mouth, dizziness or visual disturbances. 3. Avoid touching the patch. Wash your hands with soap and water  after contact with the patch.

## 2015-04-18 ENCOUNTER — Encounter (HOSPITAL_BASED_OUTPATIENT_CLINIC_OR_DEPARTMENT_OTHER): Payer: Self-pay | Admitting: Otolaryngology

## 2015-04-18 LAB — POCT HEMOGLOBIN-HEMACUE: Hemoglobin: 12.9 g/dL (ref 12.0–15.0)

## 2015-05-02 ENCOUNTER — Other Ambulatory Visit: Payer: Self-pay | Admitting: Family Medicine

## 2015-05-02 NOTE — Telephone Encounter (Signed)
[place] for refill on Diflucan? Last filled 03/07/15 #27 with 0

## 2015-05-02 NOTE — Telephone Encounter (Signed)
Medication filled to pharmacy as requested.   

## 2015-05-03 NOTE — Telephone Encounter (Signed)
If pt took the medication as directed, she should have enough medication for 4 more months

## 2015-05-12 ENCOUNTER — Other Ambulatory Visit: Payer: Self-pay | Admitting: General Practice

## 2015-05-12 MED ORDER — ALPRAZOLAM 0.25 MG PO TABS: 0.2500 mg | ORAL_TABLET | Freq: Two times a day (BID) | ORAL | Status: DC | PRN

## 2015-05-12 NOTE — Telephone Encounter (Signed)
Last OV 03/28/15 Alprazolam last filled 04/05/15 #30 with 0

## 2015-05-12 NOTE — Telephone Encounter (Signed)
Medication filled to pharmacy as requested.   

## 2015-06-08 ENCOUNTER — Other Ambulatory Visit: Payer: Self-pay | Admitting: Family Medicine

## 2015-06-08 NOTE — Telephone Encounter (Signed)
Medication filled to pharmacy as requested.   

## 2015-07-04 ENCOUNTER — Other Ambulatory Visit: Payer: Self-pay | Admitting: Family Medicine

## 2015-08-30 IMAGING — US US BREAST*R* LIMITED INC AXILLA
1 series · 5 of 5 positions shown · non-contrast
Comparison: Previous exams.

CLINICAL DATA: 47-year-old female with a palpable abnormality in
the right breast felt by her clinician. The patient states she
cannot feel this palpable abnormality in her outer right breast
herself as her breast feel generalized lumpy.

EXAM:
DIGITAL DIAGNOSTIC  BILATERAL MAMMOGRAM WITH CAD
ULTRASOUND RIGHT BREAST

[Series 1: us breast*right* limited inc axilla · 5 of 5 slices shown]
[im 1/5]
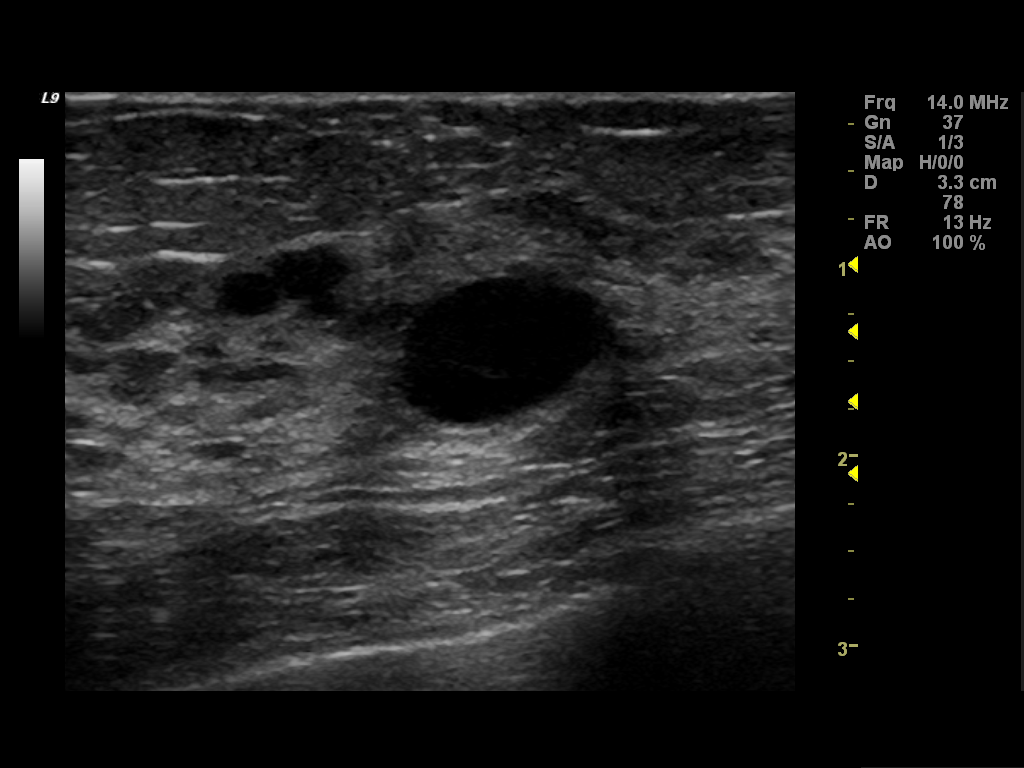
[im 2/5]
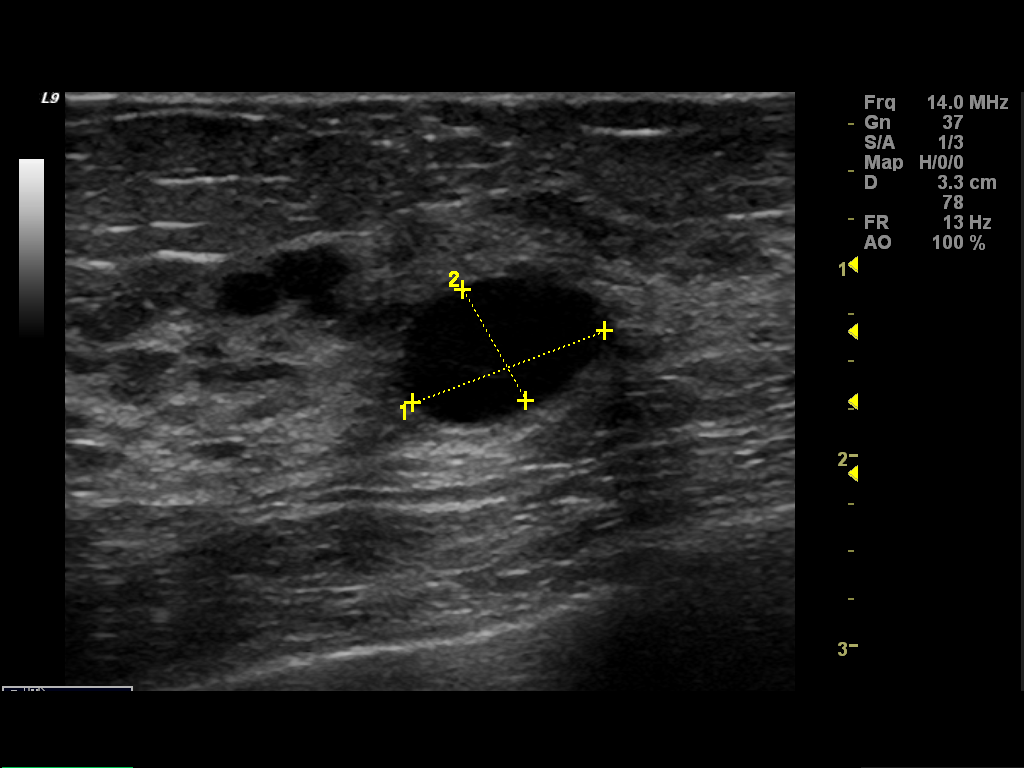
[im 3/5]
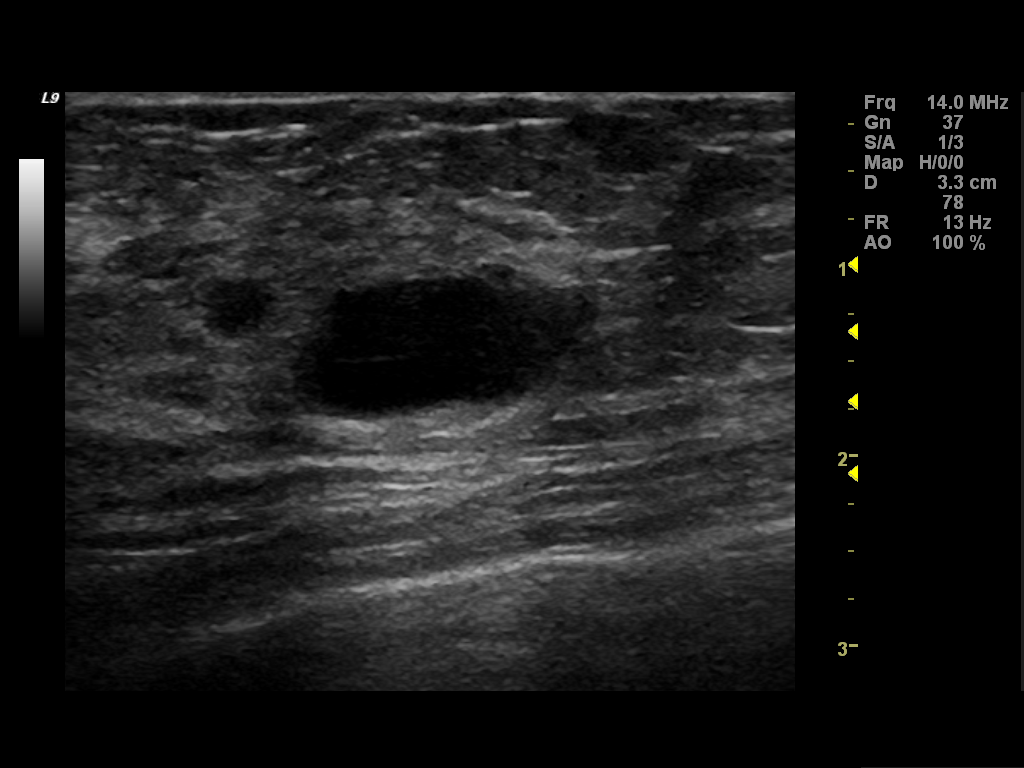
[im 4/5]
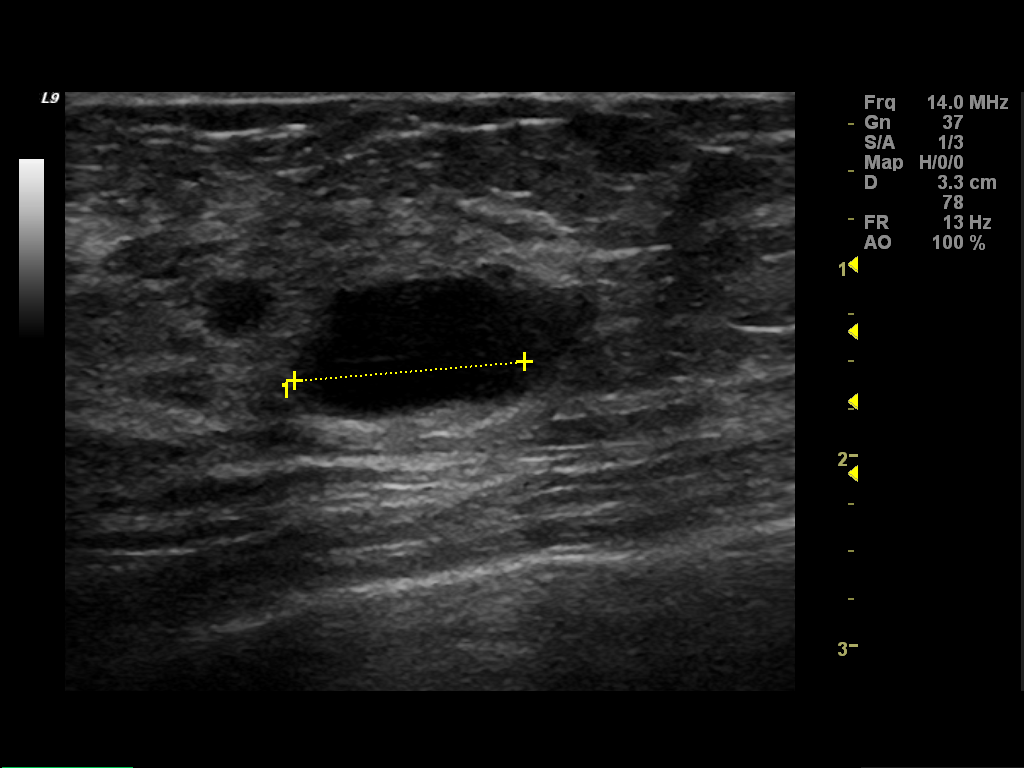
[im 5/5]
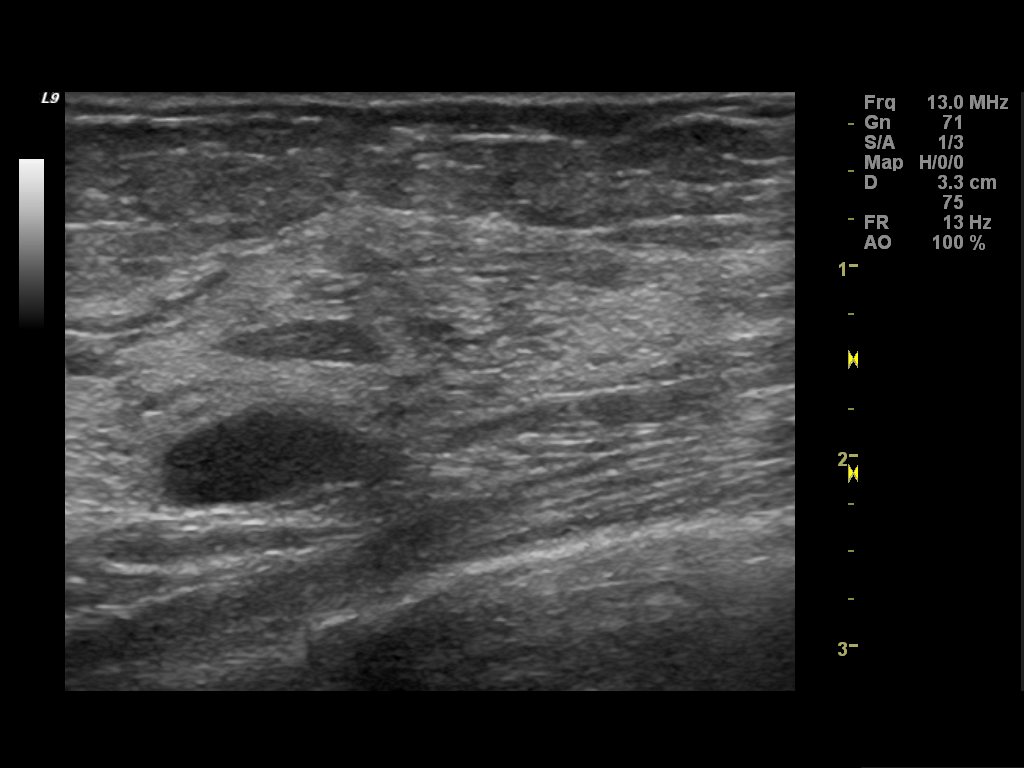

[5 of 5 positions shown; findings below may reference images not displayed]

ACR Breast Density Category c: The breast tissue is heterogeneously
dense, which may obscure small masses.
FINDINGS: No suspicious masses or calcifications are seen in either breast.
There is no mammographic evidence of malignancy in either breast.

Mammographic images were processed with CAD.

Physical examination of the outer right breast does not reveal any
palpable masses.

Targeted ultrasound of the right breast was performed demonstrating
multiple cysts in the upper-outer right breast at the approximate 10
o'clock position, with the largest of these at 10 o'clock 2 cm from
the nipple measuring 1.1 x 0.7 x 1.2 cm.
IMPRESSION: 1.  Multiple cysts seen in the upper-outer right breast.

2. No mammographic evidence of malignancy in either breast.

RECOMMENDATION:
1. The patient states these cysts do not particularly bother her and
therefore aspiration was declined today.

2.  Screening mammogram in one year.(Code:PN-Q-40I)

I have discussed the findings and recommendations with the patient.
Results were also provided in writing at the conclusion of the
visit. If applicable, a reminder letter will be sent to the patient
regarding the next appointment.

BI-RADS CATEGORY  2: Benign.

## 2015-08-30 IMAGING — MG MM DIGITAL DIAGNOSTIC BILAT
4 series · 4 of 4 positions shown · non-contrast
Comparison: Previous exams.

CLINICAL DATA: 47-year-old female with a palpable abnormality in
the right breast felt by her clinician. The patient states she
cannot feel this palpable abnormality in her outer right breast
herself as her breast feel generalized lumpy.

EXAM:
DIGITAL DIAGNOSTIC  BILATERAL MAMMOGRAM WITH CAD
ULTRASOUND RIGHT BREAST

[R CC]
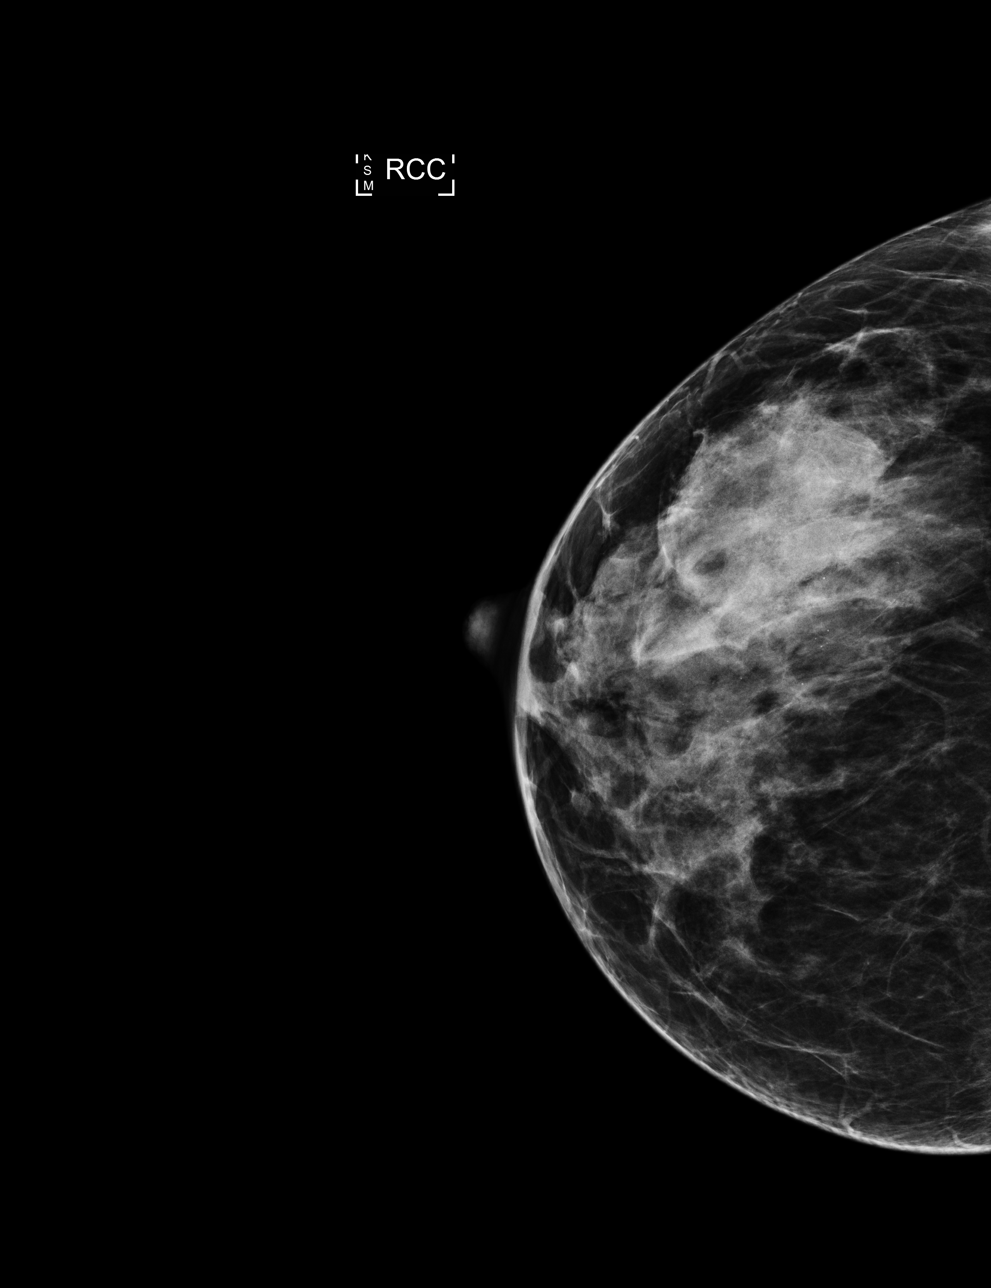

[L CC]
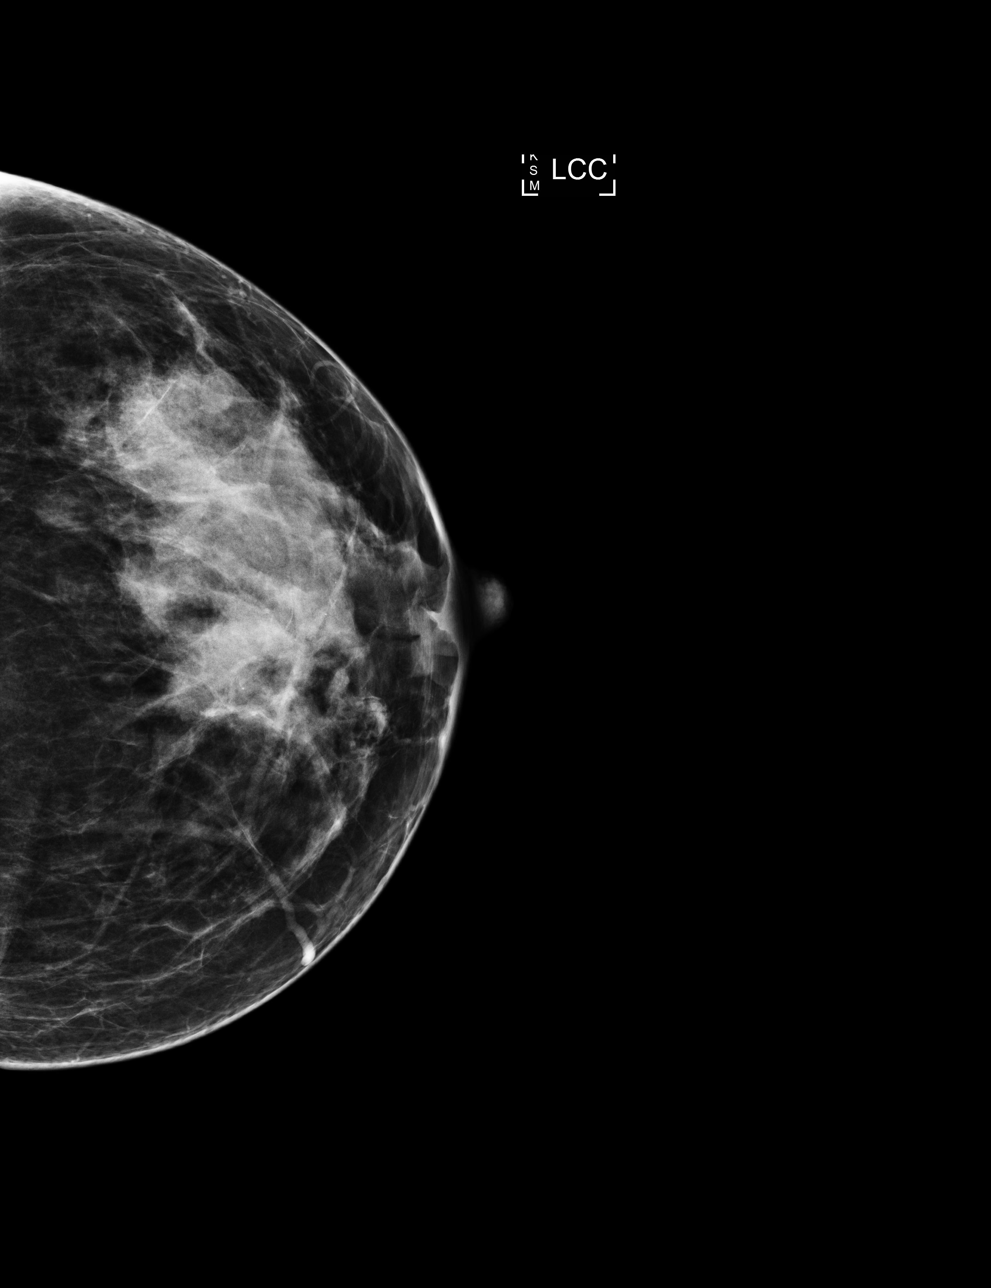

[R MLO]
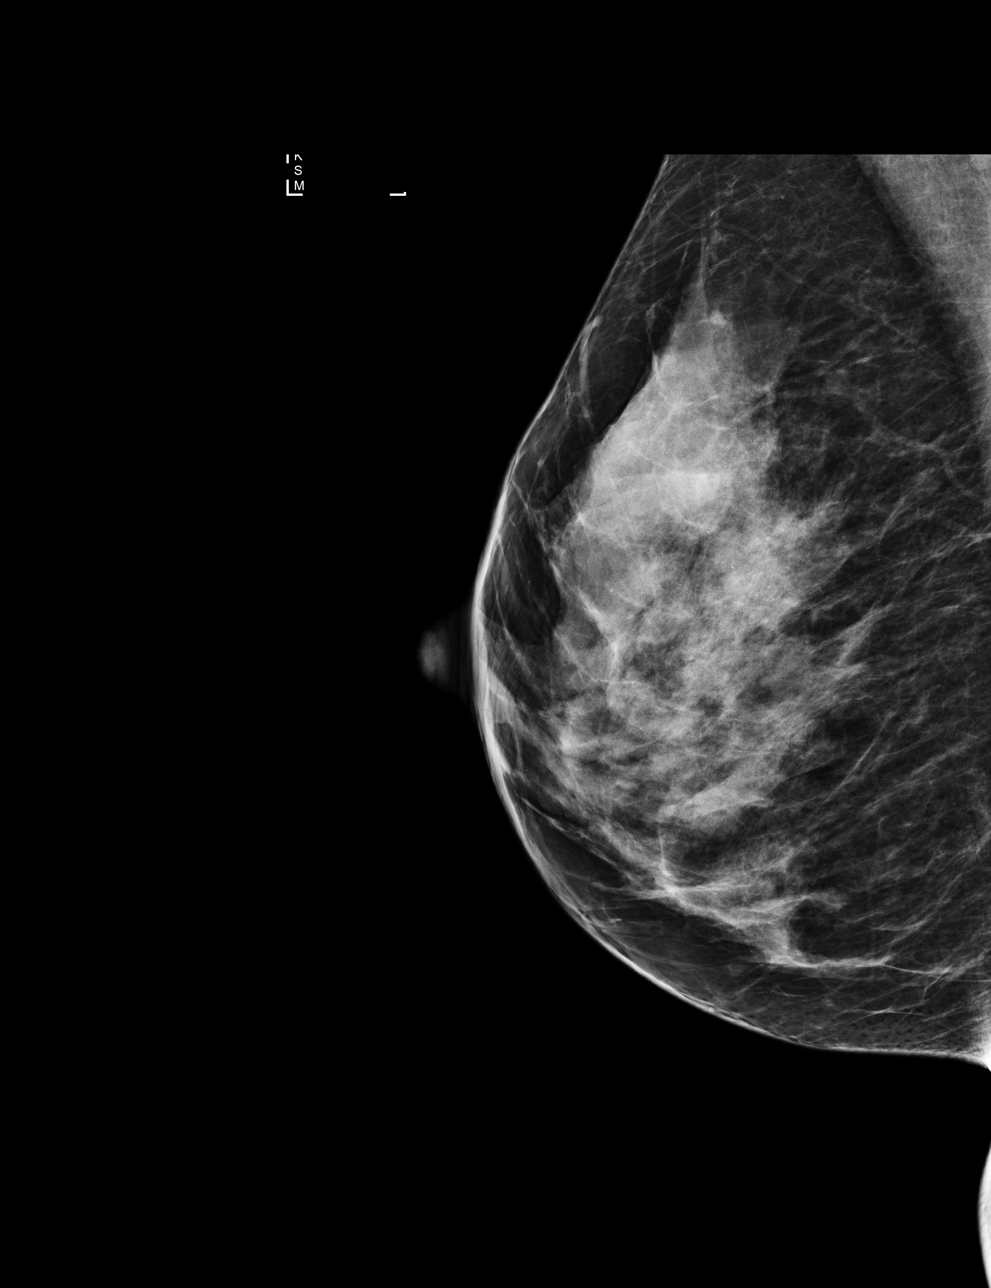

[L MLO]
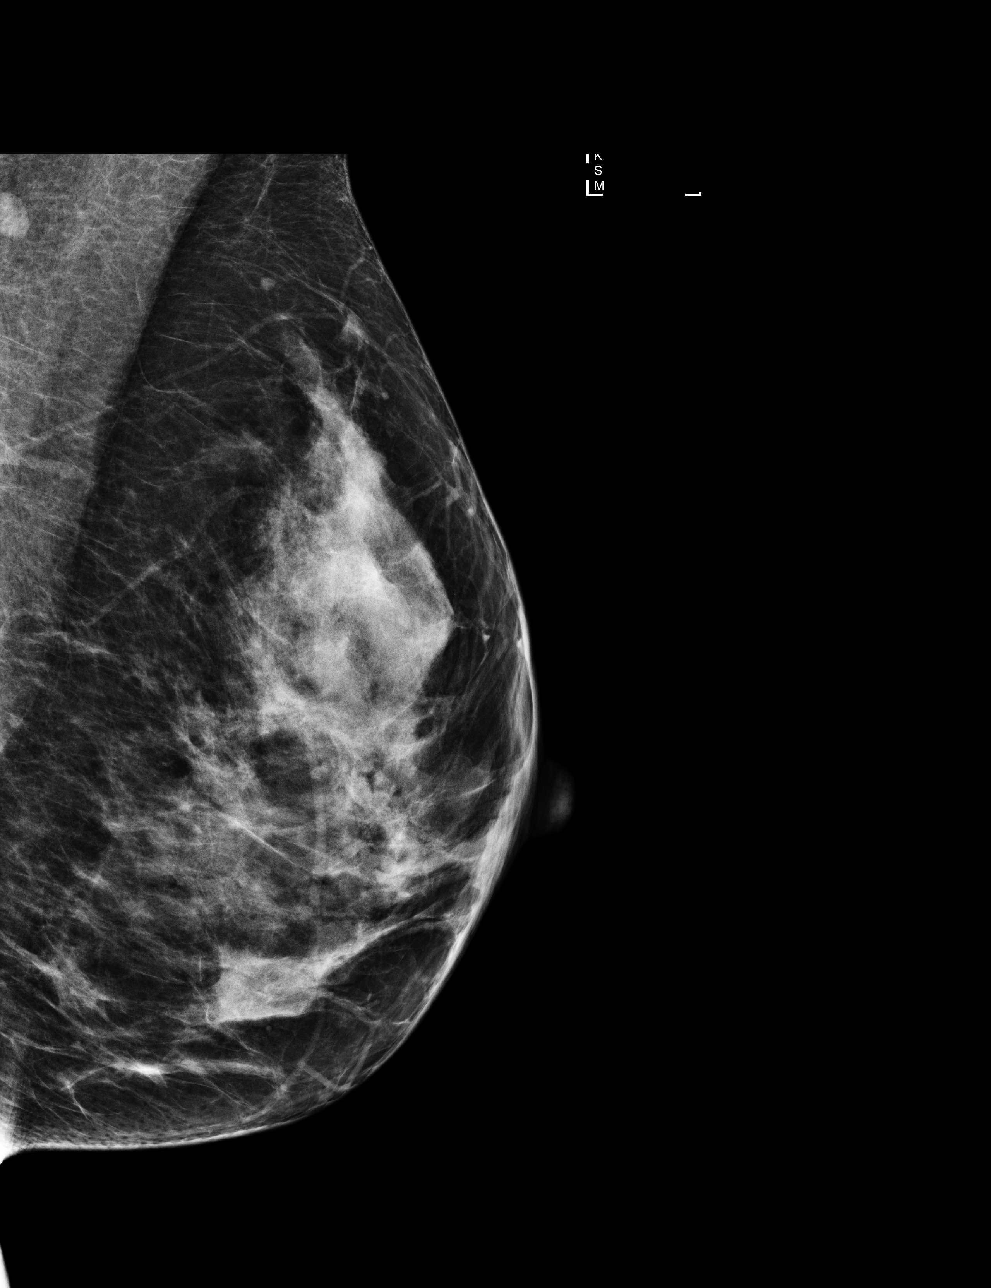

[4 of 4 positions shown; findings below may reference images not displayed]

ACR Breast Density Category c: The breast tissue is heterogeneously
dense, which may obscure small masses.
FINDINGS: No suspicious masses or calcifications are seen in either breast.
There is no mammographic evidence of malignancy in either breast.

Mammographic images were processed with CAD.

Physical examination of the outer right breast does not reveal any
palpable masses.

Targeted ultrasound of the right breast was performed demonstrating
multiple cysts in the upper-outer right breast at the approximate 10
o'clock position, with the largest of these at 10 o'clock 2 cm from
the nipple measuring 1.1 x 0.7 x 1.2 cm.
IMPRESSION: 1.  Multiple cysts seen in the upper-outer right breast.

2. No mammographic evidence of malignancy in either breast.

RECOMMENDATION:
1. The patient states these cysts do not particularly bother her and
therefore aspiration was declined today.

2.  Screening mammogram in one year.(Code:PN-Q-40I)

I have discussed the findings and recommendations with the patient.
Results were also provided in writing at the conclusion of the
visit. If applicable, a reminder letter will be sent to the patient
regarding the next appointment.

BI-RADS CATEGORY  2: Benign.

## 2015-09-05 ENCOUNTER — Other Ambulatory Visit: Payer: Self-pay | Admitting: Family Medicine

## 2015-09-05 NOTE — Telephone Encounter (Signed)
Medication filled to pharmacy as requested.   

## 2015-09-20 ENCOUNTER — Other Ambulatory Visit: Payer: Self-pay | Admitting: *Deleted

## 2015-09-20 MED ORDER — FLUTICASONE-SALMETEROL 250-50 MCG/DOSE IN AEPB
1.0000 | INHALATION_SPRAY | Freq: Two times a day (BID) | RESPIRATORY_TRACT | Status: DC
Start: 2015-09-20 — End: 2016-05-31

## 2015-09-20 NOTE — Telephone Encounter (Signed)
Refill sent per [facility] refill protocoL/SLS

## 2015-12-11 ENCOUNTER — Encounter: Payer: Self-pay | Admitting: Family Medicine

## 2016-01-12 ENCOUNTER — Encounter: Payer: Self-pay | Admitting: Family Medicine

## 2016-01-12 ENCOUNTER — Ambulatory Visit (INDEPENDENT_AMBULATORY_CARE_PROVIDER_SITE_OTHER): Payer: BLUE CROSS/BLUE SHIELD | Admitting: Family Medicine

## 2016-01-12 VITALS — BP 110/73 | HR 64 | Temp 98.3°F | Resp 16 | Ht 64.0 in | Wt 101.2 lb

## 2016-01-12 DIAGNOSIS — Z0000 Encounter for general adult medical examination without abnormal findings: Secondary | ICD-10-CM

## 2016-01-12 NOTE — Progress Notes (Signed)
   Subjective:    Patient ID: Jasmine Herrera, female    DOB: February 15, 1966, 50 y.o.   MRN: [id number]  HPI CPE- UTD on pap (Dr [name]).  Due for mammo.     Review of Systems Patient reports no vision/ hearing changes, adenopathy,fever, weight change,  persistant/recurrent hoarseness , swallowing issues, chest pain, palpitations, edema, persistant/recurrent cough, hemoptysis, dyspnea (rest/exertional/paroxysmal nocturnal), gastrointestinal bleeding (melena, rectal bleeding), abdominal pain, significant heartburn, bowel changes, GU symptoms (dysuria, hematuria, incontinence), Gyn symptoms (abnormal  bleeding, pain),  syncope, focal weakness, memory loss, numbness & tingling, skin/hair/nail changes, abnormal bruising or bleeding.   + anxiety- ongoing issue but she is in a particularly difficult situation    Objective:   Physical Exam General Appearance:    Alert, cooperative, no distress, appears stated age  Head:    Normocephalic, without obvious abnormality, atraumatic  Eyes:    PERRL, conjunctiva/corneas clear, EOM's intact, fundi    benign, both eyes  Ears:    Normal TM's and external ear canals, both ears  Nose:   Nares normal, septum midline, mucosa normal, no drainage    or sinus tenderness  Throat:   Lips, mucosa, and tongue normal; teeth and gums normal  Neck:   Supple, symmetrical, trachea midline, no adenopathy;    Thyroid: no enlargement/tenderness/nodules  Back:     Symmetric, no curvature, ROM normal, no CVA tenderness  Lungs:     Clear to auscultation bilaterally, respirations unlabored  Chest Wall:    No tenderness or deformity   Heart:    Regular rate and rhythm, S1 and S2 normal, no murmur, rub   or gallop  Breast Exam:    Deferred to GYN  Abdomen:     Soft, non-tender, bowel sounds active all four quadrants,    no masses, no organomegaly  Genitalia:    Deferred to GYN  Rectal:    Extremities:   Extremities normal, atraumatic, no cyanosis or edema  Pulses:   2+ and  symmetric all extremities  Skin:   Skin color, texture, turgor normal, no rashes or lesions  Lymph nodes:   Cervical, supraclavicular, and axillary nodes normal  Neurologic:   CNII-XII intact, normal strength, sensation and reflexes    throughout          Assessment & Plan:

## 2016-01-12 NOTE — Patient Instructions (Signed)
Go to [facility] and get the labs done at your convenience We'll notify you of your lab results and make any changes if needed Make sure you continue to eat regularly- even when stressed! If you want to increase the Prozac, please let me know! Call with any questions or concerns Hang in there!!  You're stronger than you realize!!!

## 2016-01-12 NOTE — Progress Notes (Signed)
Pre visit review using our clinic review tool, if applicable. No additional management support is needed unless otherwise documented below in the visit note. 

## 2016-01-12 NOTE — Assessment & Plan Note (Signed)
Pt's PE WNL w/ exception of being underweight.  UTD on pap.  Due for mammo- pt plans to schedule w/ GYN.  Pt to return for fasting labs.  Anticipatory guidance provided.

## 2016-01-29 ENCOUNTER — Other Ambulatory Visit: Payer: Self-pay | Admitting: Family Medicine

## 2016-02-16 ENCOUNTER — Telehealth: Payer: Self-pay | Admitting: Emergency Medicine

## 2016-02-16 NOTE — Telephone Encounter (Signed)
-----   Message from Midge Minium, MD sent at 02/16/2016  7:44 AM EDT ----- Please have pt return for labs.  Thanks! KT ----- Message ----- From: SYSTEM Sent: 02/16/2016  12:05 AM To: Midge Minium, MD

## 2016-02-16 NOTE — Telephone Encounter (Signed)
Advised patient reminder of getting her labs drawn from her 01/12/16 visit. Patient is agreeable and will go to the [facility] lab on Tuesday.

## 2016-02-27 ENCOUNTER — Other Ambulatory Visit (INDEPENDENT_AMBULATORY_CARE_PROVIDER_SITE_OTHER): Payer: BLUE CROSS/BLUE SHIELD

## 2016-02-27 DIAGNOSIS — Z0000 Encounter for general adult medical examination without abnormal findings: Secondary | ICD-10-CM | POA: Diagnosis not present

## 2016-02-27 LAB — BASIC METABOLIC PANEL
BUN: 12 mg/dL (ref 6–23)
CALCIUM: 9.3 mg/dL (ref 8.4–10.5)
CHLORIDE: 105 meq/L (ref 96–112)
CO2: 29 meq/L (ref 19–32)
Creatinine, Ser: 0.57 mg/dL (ref 0.40–1.20)
GFR: 118.46 mL/min (ref >60.00)
GLUCOSE: 79 mg/dL (ref 70–99)
Potassium: 4.4 mEq/L (ref 3.5–5.1)
Sodium: 140 mEq/L (ref 135–145)

## 2016-02-27 LAB — CBC WITH DIFFERENTIAL/PLATELET
Basophils Absolute: 0.1 10*3/uL (ref 0.0–0.1)
Basophils Relative: 1.2 % (ref 0.0–3.0)
EOS PCT: 6.3 % — AB (ref 0.0–5.0)
Eosinophils Absolute: 0.3 10*3/uL (ref 0.0–0.7)
HEMATOCRIT: 36.3 % (ref 36.0–46.0)
Hemoglobin: 12.4 g/dL (ref 12.0–15.0)
LYMPHS ABS: 1.7 10*3/uL (ref 0.7–4.0)
LYMPHS PCT: 31.7 % (ref 12.0–46.0)
MCHC: 34.2 g/dL (ref 30.0–36.0)
MCV: 87.3 fl (ref 78.0–100.0)
MONOS PCT: 7.8 % (ref 3.0–12.0)
Monocytes Absolute: 0.4 10*3/uL (ref 0.1–1.0)
NEUTROS ABS: 2.9 10*3/uL (ref 1.4–7.7)
NEUTROS PCT: 53 % (ref 43.0–77.0)
PLATELETS: 291 10*3/uL (ref 150.0–400.0)
RBC: 4.16 Mil/uL (ref 3.87–5.11)
RDW: 15 % (ref 11.5–15.5)
WBC: 5.4 10*3/uL (ref 4.0–10.5)

## 2016-02-27 LAB — LIPID PANEL
CHOLESTEROL: 211 mg/dL — AB (ref 0–200)
HDL: 79 mg/dL (ref >39.00)
LDL Cholesterol: 113 mg/dL — ABNORMAL HIGH (ref 0–99)
NONHDL: 132.31
TRIGLYCERIDES: 95 mg/dL (ref 0.0–149.0)
Total CHOL/HDL Ratio: 3
VLDL: 19 mg/dL (ref 0.0–40.0)

## 2016-02-27 LAB — HEPATIC FUNCTION PANEL
ALBUMIN: 4.2 g/dL (ref 3.5–5.2)
ALK PHOS: 43 U/L (ref 39–117)
ALT: 9 U/L (ref 0–35)
AST: 14 U/L (ref 0–37)
Bilirubin, Direct: 0.1 mg/dL (ref 0.0–0.3)
TOTAL PROTEIN: 6.8 g/dL (ref 6.0–8.3)
Total Bilirubin: 0.5 mg/dL (ref 0.2–1.2)

## 2016-02-27 LAB — VITAMIN D 25 HYDROXY (VIT D DEFICIENCY, FRACTURES): VITD: 39.12 ng/mL (ref 30.00–100.00)

## 2016-02-27 LAB — TSH: TSH: 1.16 u[IU]/mL (ref 0.35–4.50)

## 2016-02-28 ENCOUNTER — Encounter: Payer: Self-pay | Admitting: General Practice

## 2016-02-29 ENCOUNTER — Other Ambulatory Visit: Payer: Self-pay | Admitting: Family Medicine

## 2016-05-31 ENCOUNTER — Other Ambulatory Visit: Payer: Self-pay | Admitting: Family Medicine

## 2016-07-30 ENCOUNTER — Other Ambulatory Visit: Payer: Self-pay | Admitting: Family Medicine

## 2016-08-21 ENCOUNTER — Encounter: Payer: Self-pay | Admitting: General Practice

## 2016-08-21 ENCOUNTER — Ambulatory Visit (INDEPENDENT_AMBULATORY_CARE_PROVIDER_SITE_OTHER): Payer: BLUE CROSS/BLUE SHIELD | Admitting: Family Medicine

## 2016-08-21 ENCOUNTER — Encounter: Payer: Self-pay | Admitting: Family Medicine

## 2016-08-21 VITALS — BP 110/86 | HR 83 | Temp 98.0°F | Resp 16 | Ht 64.0 in | Wt 103.0 lb

## 2016-08-21 DIAGNOSIS — M5442 Lumbago with sciatica, left side: Secondary | ICD-10-CM

## 2016-08-21 MED ORDER — METHOCARBAMOL 750 MG PO TABS: 750.0000 mg | ORAL_TABLET | Freq: Three times a day (TID) | ORAL | 1 refills | Status: DC | PRN

## 2016-08-21 MED ORDER — MELOXICAM 7.5 MG PO TABS: 7.5000 mg | ORAL_TABLET | Freq: Every day | ORAL | 0 refills | Status: DC

## 2016-08-21 NOTE — Progress Notes (Signed)
Pre visit review using our clinic review tool, if applicable. No additional management support is needed unless otherwise documented below in the visit note. 

## 2016-08-21 NOTE — Patient Instructions (Signed)
Follow up as needed- particularly if not improving Take the Meloxicam once daily until feeling better- take w/ food Use the Methocarbamol nightly for spasm and pain relief Use a heating pad for pain relief Call with any questions or concerns Hang in there!!!

## 2016-08-21 NOTE — Progress Notes (Signed)
   Subjective:    Patient ID: Jasmine Herrera, female    DOB: 04-05-1966, 51 y.o.   MRN: [id number]  HPI Back pain- pt works at [employer] and is in the process of moving.  'i think I pulled something'.  Pain improves w/ heat.  Ice worsens pain.  sxs started Friday morning when waking 'sore and stiff'.  Was able to rest this weekend.  Pain is L sided, lower back.  Some radiation into the butt- 'dull ache'.  While get a sharp pain w/ certain movements.  No relief w/ tylenol.  Unable to take ibuprofen due to GI upset.   Review of Systems For ROS see HPI     Objective:   Physical Exam  Constitutional: She is oriented to person, place, and time. She appears well-developed and well-nourished. No distress.  HENT:  Head: Normocephalic and atraumatic.  Cardiovascular: Intact distal pulses.   Musculoskeletal: She exhibits tenderness (TTP over L glute and lumbar paraspinal muscle).  Neurological: She is alert and oriented to person, place, and time. She has normal reflexes.  + SLR on L  Skin: Skin is warm and dry.  Psychiatric: She has a normal mood and affect. Her behavior is normal.  Vitals reviewed.         Assessment & Plan:  LBP- new.  Occurred in the setting of increased physical activity.  sxs and PE are consistent w/ muscular etiology.  Start Robaxin nightly for spasm.  Start the once daily Mobic for relief of inflammation.  Reviewed supportive care and red flags that should prompt return.  Pt expressed understanding and is in agreement w/ plan.

## 2016-11-05 ENCOUNTER — Encounter: Payer: Self-pay | Admitting: Physician Assistant

## 2016-11-05 ENCOUNTER — Ambulatory Visit (INDEPENDENT_AMBULATORY_CARE_PROVIDER_SITE_OTHER): Payer: BLUE CROSS/BLUE SHIELD | Admitting: Physician Assistant

## 2016-11-05 VITALS — BP 118/78 | HR 65 | Temp 98.7°F | Resp 14 | Ht 64.0 in | Wt 102.0 lb

## 2016-11-05 DIAGNOSIS — R21 Rash and other nonspecific skin eruption: Secondary | ICD-10-CM

## 2016-11-05 MED ORDER — FLUCONAZOLE 150 MG PO TABS: 150.0000 mg | ORAL_TABLET | Freq: Once | ORAL | 0 refills | Status: AC

## 2016-11-05 MED ORDER — TRIAMCINOLONE ACETONIDE 0.1 % EX CREA: 1.0000 "application " | TOPICAL_CREAM | Freq: Two times a day (BID) | CUTANEOUS | 0 refills | Status: DC

## 2016-11-05 NOTE — Progress Notes (Signed)
Patient presents to clinic today c/o mild swelling and rash of upper lip x 2 days. Denies blistering. Denies rash noted elsewhere. Denies change to diet or hygiene products. Swelling has resolved since this morning. Rash still present.  Denies tongue swelling or SOB. Denies fever, chills. Endorses prior history of similar rash which was finally determined to be yeast-related. Symptoms resolved with diflucan.  Past Medical History:  Diagnosis Date  . Allergy   . Anxiety   . Asthma   . Depression   . Migraine   . Osteoporosis    no meds    Current Outpatient Prescriptions on File Prior to Visit  Medication Sig Dispense Refill  . ADVAIR DISKUS 250-50 MCG/DOSE AEPB INHALE 1 PUFF INTO THE LUNGS 2 (TWO) TIMES DAILY. 60 each 3  . albuterol (PROVENTIL HFA;VENTOLIN HFA) 108 (90 BASE) MCG/ACT inhaler Inhale 2 puffs into the lungs every 6 (six) hours as needed for wheezing or shortness of breath.    . ALPRAZolam (XANAX) 0.25 MG tablet Take 1 tablet (0.25 mg total) by mouth 2 (two) times daily as needed for anxiety. 30 tablet 0  . Cholecalciferol (VITAMIN D PO) Take 5,000 mg by mouth daily.    Marland Kitchen eletriptan (RELPAX) 40 MG tablet TAKE 1 TABLET (40 MG TOTAL) BY MOUTH ONCE. REPEAT IN 2 HOURS IF NECESSARY 4 tablet 3  . FLUoxetine (PROZAC) 20 MG capsule TAKE 1 CAPSULE (20 MG TOTAL) BY MOUTH DAILY. 30 capsule 4  . Multiple Vitamins-Minerals (B COMPLEX PLUS VITAMIN C PO) Take 1 capsule by mouth daily.    . SUMAtriptan (IMITREX) 100 MG tablet TAKE 1 TABLET (100MG  TOTAL) BY MOUTH ONCE. REPEAT IN 2 HOURS IF NEEDED. 5 tablet 3   No current facility-administered medications on file prior to visit.     Allergies  Allergen Reactions  . Penicillins Other (See Comments)    Unsure reaction as infant    Family History  Problem Relation Age of Onset  . Adopted: Yes  . Family history unknown: Yes    Social History   Social History  . Marital status: Married    Spouse name: N/A  . Number of children:  N/A  . Years of education: N/A   Social History Main Topics  . Smoking status: Never Smoker  . Smokeless tobacco: Never Used  . Alcohol use 0.6 oz/week    1 Glasses of wine per week     Comment: occasional  . Drug use: No  . Sexual activity: Yes    Birth control/ protection: None   Other Topics Concern  . None   Social History Narrative  . None   Review of Systems - See HPI.  All other ROS are negative.  BP 118/78   Pulse 65   Temp 98.7 F (37.1 C) (Oral)   Resp 14   Ht 5\' 4"  (1.626 m)   Wt 102 lb (46.3 kg)   SpO2 99%   BMI 17.51 kg/m   Physical Exam  Constitutional: She is oriented to person, place, and time and well-developed, well-nourished, and in no distress.  HENT:  Head: Normocephalic and atraumatic.  Right Ear: Tympanic membrane normal.  Left Ear: Tympanic membrane normal.  Mouth/Throat:    Eyes: Conjunctivae are normal.  Neck: Neck supple.  Cardiovascular: Normal rate, regular rhythm, normal heart sounds and intact distal pulses.   Pulmonary/Chest: Effort normal and breath sounds normal. No respiratory distress. She has no wheezes. She has no rales. She exhibits no tenderness.  Neurological: She  is alert and oriented to person, place, and time.  Skin: Skin is warm and dry.  Psychiatric: Affect normal.  Vitals reviewed.  Assessment/Plan: 1. Rash and nonspecific skin eruption Suspect a contact dermatitis. Discussed antihistamines. Start topical Kenalog to the area as she declines systemic steroids. Limit sun exposure. Will allow one-time Rx diflucan giving her history and that she is adamant that this is yeast related. Follow-up with PCP or Derm referral if not improving. She has been encouraged to restart her Probiotic, hydrate well and eat a clean diet.    Leeanne Rio, PA-C

## 2016-11-05 NOTE — Progress Notes (Signed)
Pre visit review using our clinic review tool, if applicable. No additional management support is needed unless otherwise documented below in the visit note. 

## 2016-11-05 NOTE — Patient Instructions (Signed)
Increase hydration. Eat a clean diet.  Avoid excess sun exposure. Start the kenalog cream as directed -- we will use for a max of 1 week on the perioral region. I do not want prolonged use of this medication on the face. Take Diflucan as directed. As discussed if not improvement we will need to discuss referral to Dermatology.

## 2016-11-18 ENCOUNTER — Telehealth: Payer: Self-pay | Admitting: *Deleted

## 2016-11-18 NOTE — Telephone Encounter (Signed)
Is pt having vaginal yeast?  What are her symptoms?

## 2016-11-18 NOTE — Telephone Encounter (Signed)
Patient states that it was in her ear and mouth last month - then cleared. It is coming back in the same places as well as vaginally.

## 2016-11-18 NOTE — Telephone Encounter (Signed)
Called pt she advised that she needed to have an appointment for a further refill of diflucan. Pt states that she just got back from vacation and would have to talk to her boss about taking more time off. Pt will call back to schedule.   Pt refused an appt at this time and also refused derm referral.

## 2016-11-18 NOTE — Telephone Encounter (Signed)
Pt was seen on 6/26 and tx'd w/ Diflucan 150mg  x1.  At that time she was told to f/u w/ PCP or get derm referral if no improvement.  If she has yeast in ear and mouth I need to evaluate as thrush is treated differently.

## 2016-11-18 NOTE — Telephone Encounter (Signed)
Patient calling stating that she is having issues with yeast again (she states that this is a chronic issue).   She was see in the office in June and given a diflucan, which helped, but now its back and she is working outside at [employer] and states that she really needs some relief.   She is asking if there is a way she can get mor diflucan, and for long term - or at least a longer rx  Than just the one day.

## 2016-11-28 ENCOUNTER — Other Ambulatory Visit: Payer: Self-pay | Admitting: Family Medicine

## 2016-11-28 NOTE — Telephone Encounter (Signed)
Spoke with pt, she alternates the relpax and imitrex.

## 2016-11-29 ENCOUNTER — Ambulatory Visit: Payer: BLUE CROSS/BLUE SHIELD | Admitting: Family Medicine

## 2016-12-26 ENCOUNTER — Other Ambulatory Visit: Payer: Self-pay | Admitting: General Practice

## 2016-12-26 MED ORDER — FLUOXETINE HCL 20 MG PO CAPS: ORAL_CAPSULE | ORAL | 4 refills | Status: DC

## 2017-01-14 ENCOUNTER — Other Ambulatory Visit: Payer: Self-pay | Admitting: Family Medicine

## 2017-02-24 ENCOUNTER — Other Ambulatory Visit: Payer: Self-pay | Admitting: Family Medicine

## 2017-05-23 ENCOUNTER — Other Ambulatory Visit: Payer: Self-pay | Admitting: Family Medicine

## 2017-07-24 ENCOUNTER — Telehealth: Payer: Self-pay | Admitting: Family Medicine

## 2017-07-24 ENCOUNTER — Other Ambulatory Visit: Payer: Self-pay | Admitting: Family Medicine

## 2017-07-24 NOTE — Telephone Encounter (Signed)
Copied from [place] [id number]. Topic: Quick Communication - Rx Refill/Question >> Jul 24, 2017  1:35 PM Cleaster Corin, [place] wrote: Medication:  SUMAtriptan (IMITREX) 100 MG tablet [[id number]]  eletriptan (RELPAX) 40 MG tablet [[id number]]    Has the patient contacted their pharmacy? yes  (Agent: If no, request that the patient contact the pharmacy for the refill.)   Preferred Pharmacy (with phone number or street name): [id number] [place], [address] [place] [address] Phone: [phone] Fax: [phone]     Agent: Please be advised that RX refills may take up to 3 business days. We ask that you follow-up with your pharmacy.

## 2017-07-25 ENCOUNTER — Other Ambulatory Visit: Payer: Self-pay

## 2017-07-25 MED ORDER — SUMATRIPTAN SUCCINATE 100 MG PO TABS: ORAL_TABLET | ORAL | 1 refills | Status: DC

## 2017-07-25 MED ORDER — ELETRIPTAN HYDROBROMIDE 40 MG PO TABS: ORAL_TABLET | ORAL | 3 refills | Status: DC

## 2017-07-28 ENCOUNTER — Ambulatory Visit: Payer: BLUE CROSS/BLUE SHIELD | Admitting: Family Medicine

## 2017-07-28 ENCOUNTER — Encounter: Payer: Self-pay | Admitting: Family Medicine

## 2017-07-28 ENCOUNTER — Other Ambulatory Visit: Payer: Self-pay

## 2017-07-28 VITALS — BP 118/82 | HR 74 | Temp 98.2°F | Resp 16 | Ht 64.0 in | Wt 106.1 lb

## 2017-07-28 DIAGNOSIS — F329 Major depressive disorder, single episode, unspecified: Secondary | ICD-10-CM | POA: Diagnosis not present

## 2017-07-28 DIAGNOSIS — J4520 Mild intermittent asthma, uncomplicated: Secondary | ICD-10-CM | POA: Diagnosis not present

## 2017-07-28 DIAGNOSIS — F32A Depression, unspecified: Secondary | ICD-10-CM

## 2017-07-28 MED ORDER — ALBUTEROL SULFATE HFA 108 (90 BASE) MCG/ACT IN AERS: 2.0000 | INHALATION_SPRAY | Freq: Four times a day (QID) | RESPIRATORY_TRACT | 3 refills | Status: DC | PRN

## 2017-07-28 MED ORDER — ALPRAZOLAM 0.25 MG PO TABS: 0.2500 mg | ORAL_TABLET | Freq: Two times a day (BID) | ORAL | 0 refills | Status: DC | PRN

## 2017-07-28 MED ORDER — FLUTICASONE-SALMETEROL 250-50 MCG/DOSE IN AEPB: 1.0000 | INHALATION_SPRAY | Freq: Two times a day (BID) | RESPIRATORY_TRACT | 3 refills | Status: DC

## 2017-07-28 NOTE — Patient Instructions (Signed)
Schedule your complete physical in 6 months Call and schedule GYN appt Continue the Prozac daily Use the Alprazolam as needed for those panicked moments Use the Albuterol as needed Continue the Advair to prevent asthma symptoms Call with any questions or concerns [place]!!!

## 2017-07-28 NOTE — Assessment & Plan Note (Signed)
Chronic problem.  Currently adequate control of sxs.  No need to change med or adjust dose.  Will continue to follow.

## 2017-07-28 NOTE — Assessment & Plan Note (Signed)
Pt's sxs are well controlled.  Needs refills on Advair and Albuterol- scripts sent.  Will follow.

## 2017-07-28 NOTE — Progress Notes (Signed)
   Subjective:    Patient ID: KAYSON BULLIS, female    DOB: 1965-09-19, 52 y.o.   MRN: [id number]  HPI Depression- chronic problem, on Prozac 20mg  daily.  Since last visit, pt has sold house and moved.  Still struggling w/ back child support but 'we're just thankful for what we get'.  Pt feels mood is ok.  Pt now has 2 part time jobs.  Not interested in changing meds at this time.  Pt has a few Xanax left from 2016 and is interested in a refill in case she needs it.  Asthma- pt is here today for refill on Advair and Albuterol.  Pt feels sxs are well controlled.  Denies SOB, wheezing.   Review of Systems For ROS see HPI     Objective:   Physical Exam  Constitutional: She is oriented to person, place, and time. She appears well-developed. No distress.  Thin but otherwise well appearing  HENT:  Head: Normocephalic and atraumatic.  Neurological: She is alert and oriented to person, place, and time.  Skin: Skin is warm and dry.  Psychiatric: She has a normal mood and affect. Her behavior is normal. Thought content normal.  Vitals reviewed.         Assessment & Plan:

## 2017-09-24 ENCOUNTER — Other Ambulatory Visit: Payer: Self-pay | Admitting: Family Medicine

## 2017-10-05 ENCOUNTER — Other Ambulatory Visit: Payer: Self-pay | Admitting: Family Medicine

## 2017-10-20 ENCOUNTER — Ambulatory Visit (INDEPENDENT_AMBULATORY_CARE_PROVIDER_SITE_OTHER): Payer: BLUE CROSS/BLUE SHIELD | Admitting: Family Medicine

## 2017-10-20 ENCOUNTER — Encounter: Payer: Self-pay | Admitting: Family Medicine

## 2017-10-20 ENCOUNTER — Other Ambulatory Visit: Payer: Self-pay

## 2017-10-20 VITALS — BP 120/80 | HR 73 | Temp 99.0°F | Resp 16 | Ht 64.0 in | Wt 105.6 lb

## 2017-10-20 DIAGNOSIS — Z0000 Encounter for general adult medical examination without abnormal findings: Secondary | ICD-10-CM | POA: Diagnosis not present

## 2017-10-20 DIAGNOSIS — E785 Hyperlipidemia, unspecified: Secondary | ICD-10-CM | POA: Diagnosis not present

## 2017-10-20 DIAGNOSIS — Z23 Encounter for immunization: Secondary | ICD-10-CM

## 2017-10-20 DIAGNOSIS — E559 Vitamin D deficiency, unspecified: Secondary | ICD-10-CM | POA: Diagnosis not present

## 2017-10-20 LAB — HEPATIC FUNCTION PANEL
ALT: 11 U/L (ref 0–35)
AST: 18 U/L (ref 0–37)
Albumin: 4.3 g/dL (ref 3.5–5.2)
Alkaline Phosphatase: 53 U/L (ref 39–117)
BILIRUBIN DIRECT: 0.1 mg/dL (ref 0.0–0.3)
TOTAL PROTEIN: 6.8 g/dL (ref 6.0–8.3)
Total Bilirubin: 0.5 mg/dL (ref 0.2–1.2)

## 2017-10-20 LAB — BASIC METABOLIC PANEL
BUN: 10 mg/dL (ref 6–23)
CALCIUM: 9.6 mg/dL (ref 8.4–10.5)
CO2: 29 mEq/L (ref 19–32)
Chloride: 101 mEq/L (ref 96–112)
Creatinine, Ser: 0.61 mg/dL (ref 0.40–1.20)
GFR: 108.85 mL/min (ref >60.00)
GLUCOSE: 118 mg/dL — AB (ref 70–99)
POTASSIUM: 4.1 meq/L (ref 3.5–5.1)
SODIUM: 138 meq/L (ref 135–145)

## 2017-10-20 LAB — CBC WITH DIFFERENTIAL/PLATELET
BASOS ABS: 0 10*3/uL (ref 0.0–0.1)
Basophils Relative: 0.7 % (ref 0.0–3.0)
EOS PCT: 6.5 % — AB (ref 0.0–5.0)
Eosinophils Absolute: 0.4 10*3/uL (ref 0.0–0.7)
HCT: 37.6 % (ref 36.0–46.0)
Hemoglobin: 12.8 g/dL (ref 12.0–15.0)
LYMPHS PCT: 28 % (ref 12.0–46.0)
Lymphs Abs: 1.8 10*3/uL (ref 0.7–4.0)
MCHC: 34 g/dL (ref 30.0–36.0)
MCV: 90.7 fl (ref 78.0–100.0)
MONOS PCT: 6.2 % (ref 3.0–12.0)
Monocytes Absolute: 0.4 10*3/uL (ref 0.1–1.0)
NEUTROS ABS: 3.8 10*3/uL (ref 1.4–7.7)
Neutrophils Relative %: 58.6 % (ref 43.0–77.0)
PLATELETS: 231 10*3/uL (ref 150.0–400.0)
RBC: 4.15 Mil/uL (ref 3.87–5.11)
RDW: 14.4 % (ref 11.5–15.5)
WBC: 6.5 10*3/uL (ref 4.0–10.5)

## 2017-10-20 LAB — LIPID PANEL
CHOL/HDL RATIO: 3
Cholesterol: 231 mg/dL — ABNORMAL HIGH (ref 0–200)
HDL: 76.1 mg/dL (ref >39.00)
LDL CALC: 125 mg/dL — AB (ref 0–99)
NONHDL: 155.25
Triglycerides: 149 mg/dL (ref 0.0–149.0)
VLDL: 29.8 mg/dL (ref 0.0–40.0)

## 2017-10-20 LAB — TSH: TSH: 0.91 u[IU]/mL (ref 0.35–4.50)

## 2017-10-20 LAB — VITAMIN D 25 HYDROXY (VIT D DEFICIENCY, FRACTURES): VITD: 32.75 ng/mL (ref 30.00–100.00)

## 2017-10-20 NOTE — Progress Notes (Signed)
   Subjective:    Patient ID: Jasmine Herrera, female    DOB: 03-09-1966, 52 y.o.   MRN: [id number]  HPI CPE- overdue on pap, mammo.  UTD on colonoscopy.  Due for Tdap   Review of Systems Patient reports no vision/ hearing changes, adenopathy,fever, weight change,  persistant/recurrent hoarseness , swallowing issues, chest pain, palpitations, edema, persistant/recurrent cough, hemoptysis, dyspnea (rest/exertional/paroxysmal nocturnal), gastrointestinal bleeding (melena, rectal bleeding), abdominal pain, significant heartburn, bowel changes, GU symptoms (dysuria, hematuria, incontinence), Gyn symptoms (abnormal  bleeding, pain),  syncope, focal weakness, memory loss, numbness & tingling, skin/hair/nail changes, abnormal bruising or bleeding, anxiety, or depression.     Objective:   Physical Exam General Appearance:    Alert, cooperative, no distress, appears stated age  Head:    Normocephalic, without obvious abnormality, atraumatic  Eyes:    PERRL, conjunctiva/corneas clear, EOM's intact, fundi    benign, both eyes  Ears:    Normal TM's and external ear canals, both ears  Nose:   Nares normal, septum midline, mucosa normal, no drainage    or sinus tenderness  Throat:   Lips, mucosa, and tongue normal; teeth and gums normal  Neck:   Supple, symmetrical, trachea midline, no adenopathy;    Thyroid: no enlargement/tenderness/nodules  Back:     Symmetric, no curvature, ROM normal, no CVA tenderness  Lungs:     Clear to auscultation bilaterally, respirations unlabored  Chest Wall:    No tenderness or deformity   Heart:    Regular rate and rhythm, S1 and S2 normal, no murmur, rub   or gallop  Breast Exam:    Deferred to GYN  Abdomen:     Soft, non-tender, bowel sounds active all four quadrants,    no masses, no organomegaly  Genitalia:    Deferred to GYN  Rectal:    Extremities:   Extremities normal, atraumatic, no cyanosis or edema  Pulses:   2+ and symmetric all extremities  Skin:   Skin  color, texture, turgor normal, no rashes or lesions  Lymph nodes:   Cervical, supraclavicular, and axillary nodes normal  Neurologic:   CNII-XII intact, normal strength, sensation and reflexes    throughout          Assessment & Plan:

## 2017-10-20 NOTE — Assessment & Plan Note (Signed)
Pt has hx of Vit D deficiency.  Check labs.  Replete prn. 

## 2017-10-20 NOTE — Addendum Note (Signed)
Addended by: Anibal Henderson on: 10/20/2017 03:08 PM   Modules accepted: Orders

## 2017-10-20 NOTE — Assessment & Plan Note (Signed)
Pt's PE unchanged from previous.  UTD on colonoscopy.  Overdue on GYN- encouraged her to schedule.  Check labs.  Tdap updated.  Anticipatory guidance provided.

## 2017-10-20 NOTE — Patient Instructions (Signed)
Follow up in 1 year or as needed We'll notify you of your lab results and make any changes if needed Keep up the good work!  You look great! CALL AND SCHEDULE YOUR GYN APPT Call with any questions or concerns Have a great summer!!

## 2017-10-21 ENCOUNTER — Encounter: Payer: Self-pay | Admitting: General Practice

## 2017-12-06 ENCOUNTER — Other Ambulatory Visit: Payer: Self-pay | Admitting: Family Medicine

## 2017-12-24 DIAGNOSIS — Z01419 Encounter for gynecological examination (general) (routine) without abnormal findings: Secondary | ICD-10-CM | POA: Diagnosis not present

## 2017-12-24 DIAGNOSIS — Z681 Body mass index (BMI) 19 or less, adult: Secondary | ICD-10-CM | POA: Diagnosis not present

## 2017-12-24 DIAGNOSIS — G43909 Migraine, unspecified, not intractable, without status migrainosus: Secondary | ICD-10-CM | POA: Diagnosis not present

## 2018-01-13 DIAGNOSIS — Z1231 Encounter for screening mammogram for malignant neoplasm of breast: Secondary | ICD-10-CM | POA: Diagnosis not present

## 2018-01-23 ENCOUNTER — Other Ambulatory Visit: Payer: Self-pay | Admitting: Family Medicine

## 2018-02-04 ENCOUNTER — Other Ambulatory Visit: Payer: Self-pay | Admitting: Family Medicine

## 2018-02-05 ENCOUNTER — Other Ambulatory Visit: Payer: Self-pay | Admitting: Family Medicine

## 2018-04-11 ENCOUNTER — Other Ambulatory Visit: Payer: Self-pay | Admitting: Family Medicine

## 2018-05-19 ENCOUNTER — Other Ambulatory Visit: Payer: Self-pay | Admitting: Family Medicine

## 2018-05-19 ENCOUNTER — Telehealth: Payer: Self-pay | Admitting: Family Medicine

## 2018-05-19 MED ORDER — FLUOXETINE HCL 20 MG PO CAPS: 20.0000 mg | ORAL_CAPSULE | Freq: Every day | ORAL | 0 refills | Status: DC

## 2018-05-19 NOTE — Telephone Encounter (Signed)
Copied from [place] [id number]. Topic: Quick Communication - Rx Refill/Question >> May 19, 2018  5:00 PM [place], [place] wrote: Medication:FLUoxetine (PROZAC) 20 MG capsule  Has the patient contacted their pharmacy?Yes  Preferred Pharmacy (with phone number or street name): [id number] [place], [place] [phone] (Phone) [phone] (Fax)    Agent: Please be advised that RX refills may take up to 3 business days. We ask that you follow-up with your pharmacy.

## 2018-05-19 NOTE — Telephone Encounter (Signed)
Courtesy refill.  Needs OV for further refills

## 2018-05-19 NOTE — Telephone Encounter (Signed)
Call place to patient overdue for office visit. Left VM to call office for appointment.

## 2018-06-15 ENCOUNTER — Other Ambulatory Visit: Payer: Self-pay | Admitting: Family Medicine

## 2018-06-18 ENCOUNTER — Other Ambulatory Visit: Payer: Self-pay | Admitting: Family Medicine

## 2018-07-14 ENCOUNTER — Other Ambulatory Visit: Payer: Self-pay | Admitting: Family Medicine

## 2018-07-16 NOTE — Telephone Encounter (Signed)
Last OV 10/20/17 Alprazolam last filled 07/28/17 #30 with 0

## 2018-07-17 DIAGNOSIS — Z8632 Personal history of gestational diabetes: Secondary | ICD-10-CM | POA: Insufficient documentation

## 2018-07-17 DIAGNOSIS — Z9109 Other allergy status, other than to drugs and biological substances: Secondary | ICD-10-CM | POA: Insufficient documentation

## 2018-07-17 DIAGNOSIS — N951 Menopausal and female climacteric states: Secondary | ICD-10-CM | POA: Insufficient documentation

## 2018-09-29 ENCOUNTER — Other Ambulatory Visit: Payer: Self-pay | Admitting: Family Medicine

## 2018-09-29 NOTE — Telephone Encounter (Signed)
Last OV 10/20/2017 ok to fill?

## 2020-01-26 LAB — HM MAMMOGRAPHY

## 2020-01-28 LAB — HM PAP SMEAR

## 2020-11-02 ENCOUNTER — Emergency Department (HOSPITAL_BASED_OUTPATIENT_CLINIC_OR_DEPARTMENT_OTHER)
Admission: EM | Admit: 2020-11-02 | Discharge: 2020-11-02 | Disposition: A | Discharge status: Home / Self Care | Payer: Managed Care, Other (non HMO) | Attending: Emergency Medicine | Admitting: Emergency Medicine

## 2020-11-02 ENCOUNTER — Encounter (HOSPITAL_BASED_OUTPATIENT_CLINIC_OR_DEPARTMENT_OTHER): Payer: Self-pay | Admitting: Emergency Medicine

## 2020-11-02 ENCOUNTER — Other Ambulatory Visit: Payer: Self-pay

## 2020-11-02 DIAGNOSIS — R519 Headache, unspecified: Secondary | ICD-10-CM | POA: Diagnosis not present

## 2020-11-02 DIAGNOSIS — J4520 Mild intermittent asthma, uncomplicated: Secondary | ICD-10-CM | POA: Insufficient documentation

## 2020-11-02 DIAGNOSIS — H60312 Diffuse otitis externa, left ear: Secondary | ICD-10-CM | POA: Diagnosis not present

## 2020-11-02 DIAGNOSIS — Z7951 Long term (current) use of inhaled steroids: Secondary | ICD-10-CM | POA: Diagnosis not present

## 2020-11-02 DIAGNOSIS — H9202 Otalgia, left ear: Secondary | ICD-10-CM | POA: Diagnosis present

## 2020-11-02 DIAGNOSIS — L739 Follicular disorder, unspecified: Secondary | ICD-10-CM | POA: Diagnosis not present

## 2020-11-02 MED ORDER — ACETAMINOPHEN 325 MG PO TABS: 650.0000 mg | ORAL_TABLET | Freq: Once | ORAL | Status: DC

## 2020-11-02 MED ORDER — CIPROFLOXACIN-DEXAMETHASONE 0.3-0.1 % OT SUSP: 4.0000 [drp] | Freq: Two times a day (BID) | OTIC | 0 refills | Status: DC

## 2020-11-02 MED ORDER — NAPROXEN 250 MG PO TABS: 500.0000 mg | ORAL_TABLET | Freq: Once | ORAL | Status: DC

## 2020-11-02 MED ORDER — DOXYCYCLINE HYCLATE 100 MG PO CAPS: 100.0000 mg | ORAL_CAPSULE | Freq: Two times a day (BID) | ORAL | 0 refills | Status: DC

## 2020-11-02 MED ORDER — NAPROXEN 500 MG PO TABS: 500.0000 mg | ORAL_TABLET | Freq: Two times a day (BID) | ORAL | 0 refills | Status: DC

## 2020-11-02 MED ORDER — FLUCONAZOLE 150 MG PO TABS: 150.0000 mg | ORAL_TABLET | ORAL | 0 refills | Status: DC | PRN

## 2020-11-02 NOTE — ED Provider Notes (Signed)
[facility] DEPT Provider Note   CSN: [id number] Arrival date & time: 11/02/20  1520     History Chief Complaint  Patient presents with   Otalgia    Jasmine Herrera is a 55 y.o. female.  HPI     55 year old female comes in with chief complaint of ear pain and scalp pain.  For the last several weeks she has had some discomfort over her left ear.  Her right ear was troubling her prior to that, she saw her PCP and was prescribed with some topical meds.  Now her left ear is bothering her.  She denies any hearing loss associated with it.  Patient does endorse to using repetitive cleaning of the ear with Q-tip and essential oils.  She has had foul-smelling discharge and plaques, concerning for yeast infection in the past, which is why she keeps the ears clean.  Additionally, over the scalp she has had some pain and boil for the last few days.  Patient does not have any immunosuppression, she is not diabetic.  Past Medical History:  Diagnosis Date   Allergy    Anxiety    Asthma    Depression    Migraine    Osteoporosis    no meds    Patient Active Problem List   Diagnosis Date Noted   Vitamin D deficiency 03/07/2015   Physical exam 12/07/2014   Allergic rhinitis 08/22/2014   Menstrually related migraine 08/19/2012   Osteoporosis, unspecified 08/19/2012   Depression 08/19/2012   Asthma, mild 08/19/2012    Past Surgical History:  Procedure Laterality Date   FRACTURE SURGERY  1983   left femur   NASAL SEPTOPLASTY W/ TURBINOPLASTY Bilateral 04/17/2015   Procedure: NASAL SEPTOPLASTY WITH TURBINATE REDUCTION;  Surgeon: Jodi Marble, MD;  Location: [facility];  Service: ENT;  Laterality: Bilateral;   NASAL SINUS SURGERY Left 04/17/2015   Procedure: ENDOSCOPIC REMOVAL ANTROCHOANAL POLYP;  Surgeon: Jodi Marble, MD;  Location: [facility];  Service: ENT;  Laterality: Left;   TONSILLECTOMY AND ADENOIDECTOMY     WISDOM TOOTH  EXTRACTION       OB History   No obstetric history on file.     Family History  Adopted: Yes  Family history unknown: Yes    Social History   Tobacco Use   Smoking status: Never   Smokeless tobacco: Never  Vaping Use   Vaping Use: Never used  Substance Use Topics   Alcohol use: Yes    Alcohol/week: 1.0 standard drink    Types: 1 Glasses of wine per week    Comment: occasional   Drug use: No    Home Medications Prior to Admission medications   Medication Sig Start Date End Date Taking? Authorizing Provider  ciprofloxacin-dexamethasone (CIPRODEX) OTIC suspension Place 4 drops into the left ear 2 (two) times daily. 11/02/20  Yes Varney Biles, MD  doxycycline (VIBRAMYCIN) 100 MG capsule Take 1 capsule (100 mg total) by mouth 2 (two) times daily. 11/02/20  Yes Midge Momon, MD  naproxen (NAPROSYN) 500 MG tablet Take 1 tablet (500 mg total) by mouth 2 (two) times daily. 11/02/20  Yes Marnie Fazzino, MD  ALPRAZolam (XANAX) 0.25 MG tablet TAKE 1 TABLET BY MOUTH 2 TIMES DAILY AS NEEDED FOR ANXIETY. 07/17/18   Midge Minium, MD  Cholecalciferol (VITAMIN D PO) Take 5,000 mg by mouth daily.    [provider]  eletriptan (RELPAX) 40 MG tablet TAKE 1 TABLET BY MOUTH ONCE AND REPEAT  IN 2 HRS IF NEEDED 07/25/17   Midge Minium, MD  eletriptan (RELPAX) 40 MG tablet TAKE 1 TABLET BY MOUTH ONCE AND REPEAT IN 2 HRS IF NEEDED 06/18/18   Midge Minium, MD  FLUoxetine (PROZAC) 20 MG capsule Take 1 capsule (20 mg total) by mouth daily. Schedule follow up appointment for mood for more refills 07/14/18   Midge Minium, MD  Fluticasone-Salmeterol (ADVAIR DISKUS) 250-50 MCG/DOSE AEPB Inhale 1 puff into the lungs 2 (two) times daily. 07/28/17   Midge Minium, MD  Multiple Vitamins-Minerals (B COMPLEX PLUS VITAMIN C PO) Take 1 capsule by mouth daily.    [provider]  PROAIR HFA 108 (90 Base) MCG/ACT inhaler INHALE 2 PUFFS BY MOUTH EVERY 6 HOURS AS NEEDED FOR  WHEEZE OR SHORTNESS OF BREATH 09/29/18   Midge Minium, MD  SUMAtriptan (IMITREX) 100 MG tablet TAKE 1 TABLET BY MOUTH ONCE. MAY REPEAT DOSE IN 2 HOURS IF NEEDED. 07/17/18   Midge Minium, MD    Allergies    Penicillins and Beeswax  Review of Systems   Review of Systems  Constitutional:  Positive for activity change.  HENT:  Positive for ear pain. Negative for ear discharge, hearing loss and tinnitus.   Skin:  Positive for rash.  Allergic/Immunologic: Negative for immunocompromised state.  All other systems reviewed and are negative.  Physical Exam Updated Vital Signs BP 128/78 (BP Location: Left Arm)   Pulse 75   Temp 98.8 F (37.1 C)   Resp 17   Ht 5\' 4"  (1.626 m)   Wt 50.8 kg   LMP 04/17/2015 Comment: irreg  SpO2 96%   BMI 19.22 kg/m   Physical Exam Vitals and nursing note reviewed.  Constitutional:      Appearance: She is well-developed.  HENT:     Head: Normocephalic and atraumatic.     Ears:     Comments: Left ear canal has edema, erythema.  The TM visualization is compromised because of the swelling.  There is no drainage. No postauricular lymphadenopathy Eyes:     Extraocular Movements: Extraocular movements intact.  Cardiovascular:     Rate and Rhythm: Normal rate.  Pulmonary:     Effort: Pulmonary effort is normal.  Abdominal:     General: Bowel sounds are normal.  Musculoskeletal:     Cervical back: Normal range of motion and neck supple.  Skin:    General: Skin is dry.     Comments: Small erythematous lesion over the scalp with crusted top. Positive induration and tenderness to palpation  Neurological:     Mental Status: She is alert and oriented to person, place, and time.    ED Results / Procedures / Treatments   Labs (all labs ordered are listed, but only abnormal results are displayed) Labs Reviewed - No data to display  EKG None  Radiology No results found.  Procedures Procedures   Medications Ordered in ED Medications   naproxen (NAPROSYN) tablet 500 mg (has no administration in time range)  acetaminophen (TYLENOL) tablet 650 mg (has no administration in time range)    ED Course  I have reviewed the triage vital signs and the nursing notes.  Pertinent labs & imaging results that were available during my care of the patient were reviewed by me and considered in my medical decision making (see chart for details).    MDM Rules/Calculators/A&P  55 year old woman who is immunocompetent comes in a chief complaint of ear pain and scalp pain.  Based on exam, she has otitis externa that is moderately severe.  No clinical concern for mastoiditis or malignant otitis externa right now.  Will prescribe topical Ciprodex.  Additionally she is concerned about the scalp lesion.  It appears that she has folliculitis.  Advised topical treatment for it as well with warm compresses.  Oral antibiotics only to be taken if the symptoms are progressing.  My suspicion for fungal infection is extremely low right now.  Advised PCP follow-up and recommendation to consider ENT follow-up if this is a recurrent issue.  Final Clinical Impression(s) / ED Diagnoses Final diagnoses:  Acute diffuse otitis externa of left ear  Folliculitis    Rx / DC Orders ED Discharge Orders          Ordered    ciprofloxacin-dexamethasone (CIPRODEX) OTIC suspension  2 times daily        11/02/20 1823    naproxen (NAPROSYN) 500 MG tablet  2 times daily        11/02/20 1825    doxycycline (VIBRAMYCIN) 100 MG capsule  2 times daily        11/02/20 1827             Varney Biles, MD 11/02/20 1839

## 2020-11-02 NOTE — ED Triage Notes (Signed)
Pt arrive so ED with c/o otalgia in left ear for the past few weeks. Pt reports hx of otitis externa with fungal infection. Reports itching to back of head and ear. Pt reports repetitive cleaning of ear with tea tree oil, witch hazel, and Q-tips. Denies hearing loss and sore throat.

## 2020-11-02 NOTE — Discharge Instructions (Addendum)
You are seen in the ER for ear pain and also infection of the scalp.  The infection of the scalp should be self-limiting.  We recommend that you apply topical over-the-counter Neosporin to it and warm compresses.  Take doxycycline only if your symptoms are getting worse despite 48 hours of treatment.  The left ear has moderate otitis externa.  Take the topical antibiotic prescribed.  Naprosyn and Tylenol should be helpful with pain. The ear should be protected from water during treatment for external otitis. During bathing or showering, you can place a cotton ball coated with petroleum jelly in the ear canal.   Prevention of recurrence -- To prevent recurrence, we advise that the ear canal is self-cleaning, and that fingers, towels, cotton swabs, or other foreign objects should not beinserted into the canal.

## 2020-11-02 NOTE — ED Notes (Signed)
Patient refused medication

## 2021-03-01 DIAGNOSIS — F419 Anxiety disorder, unspecified: Secondary | ICD-10-CM | POA: Insufficient documentation

## 2021-06-12 LAB — HM MAMMOGRAPHY

## 2021-06-13 LAB — HM PAP SMEAR

## 2022-02-19 ENCOUNTER — Encounter (HOSPITAL_BASED_OUTPATIENT_CLINIC_OR_DEPARTMENT_OTHER): Payer: Self-pay | Admitting: Nurse Practitioner

## 2022-02-19 ENCOUNTER — Ambulatory Visit (INDEPENDENT_AMBULATORY_CARE_PROVIDER_SITE_OTHER): Payer: BC Managed Care – PPO | Admitting: Nurse Practitioner

## 2022-02-19 VITALS — BP 122/72 | HR 62 | Ht 64.0 in | Wt 108.4 lb

## 2022-02-19 DIAGNOSIS — Z13220 Encounter for screening for lipoid disorders: Secondary | ICD-10-CM

## 2022-02-19 DIAGNOSIS — G43909 Migraine, unspecified, not intractable, without status migrainosus: Secondary | ICD-10-CM | POA: Insufficient documentation

## 2022-02-19 DIAGNOSIS — G43019 Migraine without aura, intractable, without status migrainosus: Secondary | ICD-10-CM

## 2022-02-19 DIAGNOSIS — Z0000 Encounter for general adult medical examination without abnormal findings: Secondary | ICD-10-CM | POA: Diagnosis not present

## 2022-02-19 DIAGNOSIS — Z131 Encounter for screening for diabetes mellitus: Secondary | ICD-10-CM | POA: Diagnosis not present

## 2022-02-19 DIAGNOSIS — J4520 Mild intermittent asthma, uncomplicated: Secondary | ICD-10-CM

## 2022-02-19 DIAGNOSIS — R87619 Unspecified abnormal cytological findings in specimens from cervix uteri: Secondary | ICD-10-CM | POA: Insufficient documentation

## 2022-02-19 MED ORDER — RIZATRIPTAN BENZOATE 10 MG PO TABS: 10.0000 mg | ORAL_TABLET | ORAL | 11 refills | Status: DC | PRN

## 2022-02-19 NOTE — Patient Instructions (Signed)
Thank you for choosing [facility] for your Primary Care needs. I am excited for the opportunity to partner with you to meet your health care goals. It was a pleasure meeting you today!  Recommendations from today's visit: I will be in touch with you when we have all of the lab results back and I have had a chance to review these. If we need to make any changes I will let you know.   Information on diet, exercise, and health maintenance recommendations are listed below. This is information to help you be sure you are on track for optimal health and monitoring.   Please look over this and let us know if you have any questions or if you have completed any of the health maintenance outside of [place] so that we can be sure your records are up to date.  ___________________________________________________________ About Me: I am an Adult-Geriatric Nurse Practitioner with a background in caring for patients for more than 20 years with a strong intensive care background. I provide primary care and sports medicine services to patients age 55 and older within this office. My education had a strong focus on caring for the older adult population, which I am passionate about. I am also the director of the APP Fellowship with [employer] [facility].   My desire is to provide you with the best service through preventive medicine and supportive care. I consider you a part of the medical team and value your input. I work diligently to ensure that you are heard and your needs are met in a safe and effective manner. I want you to feel comfortable with me as your provider and want you to know that your health concerns are important to me.  For your information, our office hours are: Monday, Tuesday, and Thursday 8:00 AM - 5:00 PM Wednesday and Friday 8:00 AM - 12:00 PM.   In my time away from the office I am teaching new APP's within the system and am unavailable, but my partner, Dr. Burnard Bunting  is in the office for emergent needs.   If you have questions or concerns, please call our office at [phone] or send [place] a MyChart message and we will respond as quickly as possible.  ____________________________________________________________ MyChart:  For all urgent or time sensitive needs we ask that you please call the office to avoid delays. Our number is [phone]. MyChart is not constantly monitored and due to the large volume of messages a day, replies may take up to 72 business hours.  MyChart Policy: MyChart allows for you to see your visit notes, after visit summary, provider recommendations, lab and tests results, make an appointment, request refills, and contact your provider or the office for non-urgent questions or concerns. Providers are seeing patients during normal business hours and do not have built in time to review MyChart messages.  We ask that you allow a minimum of 3 business days for responses to [employer]. For this reason, please do not send urgent requests through [place]. Please call the office at [phone]. New and ongoing conditions may require a visit. We have virtual and in person visit available for your convenience.  Complex MyChart concerns may require a visit. Your provider may request you schedule a virtual or in person visit to ensure we are providing the best care possible. MyChart messages sent after 11:00 AM on Friday will not be received by the provider until Monday morning.    Lab and Test  Results: You will receive your lab and test results on MyChart as soon as they are completed and results have been sent by the lab or testing facility. Due to this service, you will receive your results BEFORE your provider.  I review lab and tests results each morning prior to seeing patients. Some results require collaboration with other providers to ensure you are receiving the most appropriate care. For this reason, we ask that you please allow a  minimum of 3-5 business days from the time the ALL results have been received for your provider to receive and review lab and test results and contact you about these.  Most lab and test result comments from the provider will be sent through [place]. Your provider may recommend changes to the plan of care, follow-up visits, repeat testing, ask questions, or request an office visit to discuss these results. You may reply directly to this message or call the office at [phone] to provide information for the provider or set up an appointment. In some instances, you will be called with test results and recommendations. Please let us know if this is preferred and we will make note of this in your chart to provide this for you.    If you have not heard a response to your lab or test results in 5 business days from all results returning to [place], please call the office to let us know. We ask that you please avoid calling prior to this time unless there is an emergent concern. Due to high call volumes, this can delay the resulting process.  After Hours: For all non-emergency after hours needs, please call the office at [phone] and select the option to reach the on-call provider service. On-call services are shared between multiple [place] offices and therefore it will not be possible to speak directly with your provider. On-call providers may provide medical advice and recommendations, but are unable to provide refills for maintenance medications.  For all emergency or urgent medical needs after normal business hours, we recommend that you seek care at the closest Urgent Care or Emergency Department to ensure appropriate treatment in a timely manner.  [facility] has a 24 hour emergency room located on the ground floor for your convenience.   Urgent Concerns During the Business Day Providers are seeing patients from 8AM to [place] with a busy schedule and are most often not able  to respond to non-urgent calls until the end of the day or the next business day. If you should have URGENT concerns during the day, please call and speak to the nurse or schedule a same day appointment so that we can address your concern without delay.   Thank you, again, for choosing me as your health care partner. I appreciate your trust and look forward to learning more about you.   Worthy Keeler, DNP, AGNP-c ___________________________________________________________  Health Maintenance Recommendations Screening Testing Mammogram Every 1 -2 years based on history and risk factors Starting at age 34 Pap Smear Ages 21-39 every 3 years Ages 71-65 every 5 years with HPV testing More frequent testing may be required based on results and history Colon Cancer Screening Every 1-10 years based on test performed, risk factors, and history Starting at age 52 Bone Density Screening Every 2-10 years based on history Starting at age 87 for women Recommendations for men differ based on medication usage, history, and risk factors AAA Screening One time ultrasound Men 57-61 years old who have every smoked Lung  Cancer Screening Low Dose Lung CT every 12 months Age 29-80 years with a 30 pack-year smoking history who still smoke or who have quit within the last 15 years  Screening Labs Routine  Labs: Complete Blood Count (CBC), Complete Metabolic Panel (CMP), Cholesterol (Lipid Panel) Every 6-12 months based on history and medications May be recommended more frequently based on current conditions or previous results Hemoglobin A1c Lab Every 3-12 months based on history and previous results Starting at age 108 or earlier with diagnosis of diabetes, high cholesterol, BMI >26, and/or risk factors Frequent monitoring for patients with diabetes to ensure blood sugar control Thyroid Panel (TSH w/ T3 & T4) Every 6 months based on history, symptoms, and risk factors May be repeated more often if on  medication HIV One time testing for all patients 24 and older May be repeated more frequently for patients with increased risk factors or exposure Hepatitis C One time testing for all patients 68 and older May be repeated more frequently for patients with increased risk factors or exposure Gonorrhea, Chlamydia Every 12 months for all sexually active persons 13-24 years Additional monitoring may be recommended for those who are considered high risk or who have symptoms PSA Men 89-37 years old with risk factors Additional screening may be recommended from age 21-69 based on risk factors, symptoms, and history  Vaccine Recommendations Tetanus Booster All adults every 10 years Flu Vaccine All patients 6 months and older every year COVID Vaccine All patients 12 years and older Initial dosing with booster May recommend additional booster based on age and health history HPV Vaccine 2 doses all patients age 30-26 Dosing may be considered for patients over 26 Shingles Vaccine (Shingrix) 2 doses all adults 14 years and older Pneumonia (Pneumovax 23) All adults 54 years and older May recommend earlier dosing based on health history Pneumonia (Prevnar 67) All adults 12 years and older Dosed 1 year after Pneumovax 23  Additional Screening, Testing, and Vaccinations may be recommended on an individualized basis based on family history, health history, risk factors, and/or exposure.  __________________________________________________________  Diet Recommendations for All Patients  I recommend that all patients maintain a diet low in saturated fats, carbohydrates, and cholesterol. While this can be challenging at first, it is not impossible and small changes can make big differences.  Things to try: Decreasing the amount of soda, sweet tea, and/or juice to one or less per day and replace with water While water is always the first choice, if you do not like water you may consider adding a  water additive without sugar to improve the taste other sugar free drinks Replace potatoes with a brightly colored vegetable at dinner Use healthy oils, such as canola oil or olive oil, instead of butter or hard margarine Limit your bread intake to two pieces or less a day Replace regular pasta with low carb pasta options Bake, broil, or grill foods instead of frying Monitor portion sizes  Eat smaller, more frequent meals throughout the day instead of large meals  An important thing to remember is, if you love foods that are not great for your health, you don't have to give them up completely. Instead, allow these foods to be a reward when you have done well. Allowing yourself to still have special treats every once in a while is a nice way to tell yourself thank you for working hard to keep yourself healthy.   Also remember that every day is a new day. If you have  a bad day and "fall off the wagon", you can still climb right back up and keep moving along on your journey!  We have resources available to help you!  Some websites that may be helpful include: www.http://carter.biz/  Www.[employer]Well[employer].com _____________________________________________________________  Activity Recommendations for All Patients  I recommend that all adults get at least 20 minutes of moderate physical activity that elevates your heart rate at least 5 days out of the week.  Some examples include: Walking or jogging at a pace that allows you to carry on a conversation Cycling (stationary bike or outdoors) Water aerobics Yoga Weight lifting Dancing If physical limitations prevent you from putting stress on your joints, exercise in a pool or seated in a chair are excellent options.  Do determine your MAXIMUM heart rate for activity: YOUR AGE - 220 = MAX HeartRate   Remember! Do not push yourself too hard.  Start slowly and build up your pace, speed, weight, time in exercise, etc.  Allow your body to rest between  exercise and get good sleep. You will need more water than normal when you are exerting yourself. Do not wait until you are thirsty to drink. Drink with a purpose of getting in at least 8, 8 ounce glasses of water a day plus more depending on how much you exercise and sweat.    If you begin to develop dizziness, chest pain, abdominal pain, jaw pain, shortness of breath, headache, vision changes, lightheadedness, or other concerning symptoms, stop the activity and allow your body to rest. If your symptoms are severe, seek emergency evaluation immediately. If your symptoms are concerning, but not severe, please let us know so that we can recommend further evaluation.

## 2022-02-19 NOTE — Progress Notes (Signed)
Orma Render, DNP, AGNP-c [facility] [address]  [place], [place] [address] [phone]  New patient visit   Patient: Jasmine Herrera   DOB: March 12, 1966   56 y.o. Female  MRN: [id number] Visit Date: 02/19/2022  Patient Care Team: Early, Coralee Pesa, NP as PCP - General (Nurse Practitioner) Dian Queen, MD as Consulting Physician (Obstetrics and Gynecology)  Today's Vitals   02/19/22 1309 02/19/22 1353  BP: 137/86 122/72  Pulse: 62   SpO2: 99%   Weight: 108 lb 6.4 oz (49.2 kg)   Height: 5' 4" (1.626 m)    Body mass index is 18.61 kg/m.   Today's healthcare provider: Orma Render, NP   Chief Complaint  Patient presents with   New Patient (Initial Visit)    Patient presents today to establish care.    Subjective    Jasmine Herrera is a 56 y.o. female who presents today as a new patient to establish care.    Patient endorses the following concerns presently: Migraines Uses sumatriptan or rizatriptan but has relpax if these are ineffective.  Feels these are well controlled at this time overall.   Asthma Hx with no current symptoms or exacerbations.    History reviewed and reveals the following: Past Medical History:  Diagnosis Date   Allergy    Anxiety    Asthma    Depression    Migraine    Osteoporosis    no meds   Past Surgical History:  Procedure Laterality Date   FRACTURE SURGERY  1983   left femur   NASAL SEPTOPLASTY W/ TURBINOPLASTY Bilateral 04/17/2015   Procedure: NASAL SEPTOPLASTY WITH TURBINATE REDUCTION;  Surgeon: Jodi Marble, MD;  Location: [facility];  Service: ENT;  Laterality: Bilateral;   NASAL SINUS SURGERY Left 04/17/2015   Procedure: ENDOSCOPIC REMOVAL ANTROCHOANAL POLYP;  Surgeon: Jodi Marble, MD;  Location: [facility];  Service: ENT;  Laterality: Left;   TONSILLECTOMY AND ADENOIDECTOMY     WISDOM TOOTH EXTRACTION     Family Status  Relation Name  Status   Mother  Alive   Father  Deceased   Family History  Adopted: Yes  Family history unknown: Yes   Social History   Socioeconomic History   Marital status: Married    Spouse name: Not on file   Number of children: Not on file   Years of education: Not on file   Highest education level: Not on file  Occupational History   Not on file  Tobacco Use   Smoking status: Never   Smokeless tobacco: Never  Vaping Use   Vaping Use: Never used  Substance and Sexual Activity   Alcohol use: Yes    Alcohol/week: 1.0 standard drink of alcohol    Types: 1 Glasses of wine per week    Comment: occasional   Drug use: No   Sexual activity: Yes    Birth control/protection: None  Other Topics Concern   Not on file  Social History Narrative   Not on file   Social Determinants of Health   Financial Resource Strain: Not on file  Food Insecurity: Not on file  Transportation Needs: Not on file  Physical Activity: Not on file  Stress: Not on file  Social Connections: Not on file   Outpatient Medications Prior to Visit  Medication Sig   ALPRAZolam (XANAX) 0.25 MG tablet TAKE 1 TABLET BY MOUTH 2 TIMES DAILY AS NEEDED FOR ANXIETY.  Cholecalciferol (VITAMIN D PO) Take 5,000 mg by mouth daily.   eletriptan (RELPAX) 40 MG tablet TAKE 1 TABLET BY MOUTH ONCE AND REPEAT IN 2 HRS IF NEEDED   FLUoxetine (PROZAC) 20 MG capsule Take 1 capsule (20 mg total) by mouth daily. Schedule follow up appointment for mood for more refills   Fluticasone-Salmeterol (ADVAIR DISKUS) 250-50 MCG/DOSE AEPB Inhale 1 puff into the lungs 2 (two) times daily.   Multiple Vitamins-Minerals (B COMPLEX PLUS VITAMIN C PO) Take 1 capsule by mouth daily.   PROAIR HFA 108 (90 Base) MCG/ACT inhaler INHALE 2 PUFFS BY MOUTH EVERY 6 HOURS AS NEEDED FOR WHEEZE OR SHORTNESS OF BREATH   [DISCONTINUED] SUMAtriptan (IMITREX) 100 MG tablet TAKE 1 TABLET BY MOUTH ONCE. MAY REPEAT DOSE IN 2 HOURS IF NEEDED.   [DISCONTINUED]  ciprofloxacin-dexamethasone (CIPRODEX) OTIC suspension Place 4 drops into the left ear 2 (two) times daily.   [DISCONTINUED] doxycycline (VIBRAMYCIN) 100 MG capsule Take 1 capsule (100 mg total) by mouth 2 (two) times daily.   [DISCONTINUED] eletriptan (RELPAX) 40 MG tablet TAKE 1 TABLET BY MOUTH ONCE AND REPEAT IN 2 HRS IF NEEDED   [DISCONTINUED] fluconazole (DIFLUCAN) 150 MG tablet Take 1 tablet (150 mg total) by mouth every 3 (three) days as needed for up to 2 doses (yeast infection symptoms).   [DISCONTINUED] naproxen (NAPROSYN) 500 MG tablet Take 1 tablet (500 mg total) by mouth 2 (two) times daily.   No facility-administered medications prior to visit.   Allergies  Allergen Reactions   Penicillins Other (See Comments)    Unsure reaction as infant   Beeswax Rash   Immunization History  Administered Date(s) Administered   Influenza,inj,Quad PF,6+ Mos 02/22/2020, 03/01/2021   PFIZER(Purple Top)SARS-COV-2 Vaccination 07/15/2019, 08/03/2019, 02/10/2020   Pfizer Covid-19 Vaccine Bivalent Booster 44yr & up 01/26/2021   Tdap 10/20/2017   Unspecified SARS-COV-2 Vaccination 07/15/2019, 08/03/2019    Review of Systems All review of systems negative except what is listed in the HPI   Objective    BP 122/72   Pulse 62   Ht 5' 4" (1.626 m)   Wt 108 lb 6.4 oz (49.2 kg)   LMP 04/17/2015 Comment: irreg  SpO2 99%   BMI 18.61 kg/m  Physical Exam Vitals and nursing note reviewed.  Constitutional:      General: She is not in acute distress.    Appearance: Normal appearance.  HENT:     Head: Normocephalic and atraumatic.     Right Ear: Hearing, tympanic membrane, ear canal and external ear normal.     Left Ear: Hearing, tympanic membrane, ear canal and external ear normal.     Nose: Nose normal.     Right Sinus: No maxillary sinus tenderness or frontal sinus tenderness.     Left Sinus: No maxillary sinus tenderness or frontal sinus tenderness.     Mouth/Throat:     Lips: Pink.      Mouth: Mucous membranes are moist.     Pharynx: Oropharynx is clear.  Eyes:     General: Lids are normal. Vision grossly intact.     Extraocular Movements: Extraocular movements intact.     Conjunctiva/sclera: Conjunctivae normal.     Pupils: Pupils are equal, round, and reactive to light.     Funduscopic exam:    Right eye: Red reflex present.        Left eye: Red reflex present.    Visual Fields: Right eye visual fields normal and left eye visual fields normal.  Neck:  Thyroid: No thyromegaly.     Vascular: No carotid bruit.  Cardiovascular:     Rate and Rhythm: Normal rate and regular rhythm.     Chest Wall: PMI is not displaced.     Pulses: Normal pulses.          Dorsalis pedis pulses are 2+ on the right side and 2+ on the left side.       Posterior tibial pulses are 2+ on the right side and 2+ on the left side.     Heart sounds: Normal heart sounds. No murmur heard. Pulmonary:     Effort: Pulmonary effort is normal. No respiratory distress.     Breath sounds: Normal breath sounds.  Abdominal:     General: Abdomen is flat. Bowel sounds are normal. There is no distension.     Palpations: Abdomen is soft. There is no hepatomegaly, splenomegaly or mass.     Tenderness: There is no abdominal tenderness. There is no right CVA tenderness, left CVA tenderness, guarding or rebound.  Musculoskeletal:        General: Normal range of motion.     Cervical back: Full passive range of motion without pain, normal range of motion and neck supple. No tenderness.     Right lower leg: No edema.     Left lower leg: No edema.  Feet:     Left foot:     Toenail Condition: Left toenails are normal.  Lymphadenopathy:     Cervical: No cervical adenopathy.     Upper Body:     Right upper body: No supraclavicular adenopathy.     Left upper body: No supraclavicular adenopathy.  Skin:    General: Skin is warm and dry.     Capillary Refill: Capillary refill takes less than 2 seconds.     Nails:  There is no clubbing.  Neurological:     General: No focal deficit present.     Mental Status: She is alert and oriented to person, place, and time.     GCS: GCS eye subscore is 4. GCS verbal subscore is 5. GCS motor subscore is 6.     Sensory: Sensation is intact.     Motor: Motor function is intact.     Coordination: Coordination is intact.     Gait: Gait is intact.     Deep Tendon Reflexes: Reflexes are normal and symmetric.  Psychiatric:        Attention and Perception: Attention normal.        Mood and Affect: Mood normal.        Speech: Speech normal.        Behavior: Behavior normal. Behavior is cooperative.        Thought Content: Thought content normal.        Cognition and Memory: Cognition and memory normal.        Judgment: Judgment normal.     Results for orders placed or performed in visit on 02/19/22  CBC With Diff/Platelet  Result Value Ref Range   WBC 6.4 3.4 - 10.8 x10E3/uL   RBC 4.34 3.77 - 5.28 x10E6/uL   Hemoglobin 13.2 11.1 - 15.9 g/dL   Hematocrit 40.9 34.0 - 46.6 %   MCV 94 79 - 97 fL   MCH 30.4 26.6 - 33.0 pg   MCHC 32.3 31.5 - 35.7 g/dL   RDW 12.1 11.7 - 15.4 %   Platelets 282 150 - 450 x10E3/uL   Neutrophils 49 Not Estab. %   Lymphs  35 Not Estab. %   Monocytes 10 Not Estab. %   Eos 5 Not Estab. %   Basos 1 Not Estab. %   Neutrophils Absolute 3.2 1.4 - 7.0 x10E3/uL   Lymphocytes Absolute 2.3 0.7 - 3.1 x10E3/uL   Monocytes Absolute 0.6 0.1 - 0.9 x10E3/uL   EOS (ABSOLUTE) 0.3 0.0 - 0.4 x10E3/uL   Basophils Absolute 0.1 0.0 - 0.2 x10E3/uL   Immature Granulocytes 0 Not Estab. %   Immature Grans (Abs) 0.0 0.0 - 0.1 x10E3/uL  Comprehensive metabolic panel  Result Value Ref Range   Glucose 81 70 - 99 mg/dL   BUN 9 6 - 24 mg/dL   Creatinine, Ser 0.57 0.57 - 1.00 mg/dL   eGFR 106 >59 mL/min/1.73   BUN/Creatinine Ratio 16 9 - 23   Sodium 141 134 - 144 mmol/L   Potassium 4.0 3.5 - 5.2 mmol/L   Chloride 102 96 - 106 mmol/L   CO2 24 20 - 29 mmol/L    Calcium 9.6 8.7 - 10.2 mg/dL   Total Protein 7.2 6.0 - 8.5 g/dL   Albumin 4.7 3.8 - 4.9 g/dL   Globulin, Total 2.5 1.5 - 4.5 g/dL   Albumin/Globulin Ratio 1.9 1.2 - 2.2   Bilirubin Total 0.4 0.0 - 1.2 mg/dL   Alkaline Phosphatase 90 44 - 121 IU/L   AST 15 0 - 40 IU/L   ALT 10 0 - 32 IU/L  Hemoglobin A1c  Result Value Ref Range   Hgb A1c MFr Bld 5.3 4.8 - 5.6 %   Est. average glucose Bld gHb Est-mCnc 105 mg/dL  VITAMIN D 25 Hydroxy (Vit-D Deficiency, Fractures)  Result Value Ref Range   Vit D, 25-Hydroxy 45.9 30.0 - 100.0 ng/mL  Lipid panel  Result Value Ref Range   Cholesterol, Total 251 (H) 100 - 199 mg/dL   Triglycerides 81 0 - 149 mg/dL   HDL 92 >39 mg/dL   VLDL Cholesterol Cal 13 5 - 40 mg/dL   LDL Chol Calc (NIH) 146 (H) 0 - 99 mg/dL   Chol/HDL Ratio 2.7 0.0 - 4.4 ratio    Assessment & Plan      Problem List Items Addressed This Visit     Asthma, mild intermittent    Chronic. No alarm sx present at this time. Will obtain labs today. F/U if sx worsen or new sx develop.       Encounter for medical examination to establish care - Primary    CPE today with no alarm sx present.  Review of medical history and medications today.  Labs will be obtained.  F/U in 1 year or sooner if needed.       Relevant Medications   rizatriptan (MAXALT) 10 MG tablet   Other Relevant Orders   CBC With Diff/Platelet (Completed)   Comprehensive metabolic panel (Completed)   Hemoglobin A1c (Completed)   VITAMIN D 25 Hydroxy (Vit-D Deficiency, Fractures) (Completed)   Lipid panel (Completed)   Migraine    Chronic. Controlled with prn maxalt primarily. Relpax if this is ineffective. Will plan f/u PRN for sx.       Relevant Medications   rizatriptan (MAXALT) 10 MG tablet   Other Visit Diagnoses     Health care maintenance       Relevant Medications   rizatriptan (MAXALT) 10 MG tablet   Other Relevant Orders   CBC With Diff/Platelet (Completed)   Comprehensive metabolic panel  (Completed)   Hemoglobin A1c (Completed)   VITAMIN D 25 Hydroxy (  Vit-D Deficiency, Fractures) (Completed)   Lipid panel (Completed)   Screening cholesterol level       Relevant Orders   Lipid panel (Completed)   Screening for diabetes mellitus       Relevant Orders   Hemoglobin A1c (Completed)        Return in about 1 year (around 02/20/2023) for CPE today.      Early, Coralee Pesa, NP, DNP, AGNP-C [facility] Maintenance Due Health Maintenance Topics with due status: Overdue     Topic Date Due   HIV Screening Never done   Hepatitis C Screening Never done   Zoster Vaccines- Shingrix Never done   MAMMOGRAM 11/27/2014   PAP SMEAR-Modifier 10/11/2016   INFLUENZA VACCINE 12/11/2021

## 2022-02-20 LAB — CBC WITH DIFF/PLATELET
Basophils Absolute: 0.1 10*3/uL (ref 0.0–0.2)
Basos: 1 %
EOS (ABSOLUTE): 0.3 10*3/uL (ref 0.0–0.4)
Eos: 5 %
Hematocrit: 40.9 % (ref 34.0–46.6)
Hemoglobin: 13.2 g/dL (ref 11.1–15.9)
Immature Grans (Abs): 0 10*3/uL (ref 0.0–0.1)
Immature Granulocytes: 0 %
Lymphocytes Absolute: 2.3 10*3/uL (ref 0.7–3.1)
Lymphs: 35 %
MCH: 30.4 pg (ref 26.6–33.0)
MCHC: 32.3 g/dL (ref 31.5–35.7)
MCV: 94 fL (ref 79–97)
Monocytes Absolute: 0.6 10*3/uL (ref 0.1–0.9)
Monocytes: 10 %
Neutrophils Absolute: 3.2 10*3/uL (ref 1.4–7.0)
Neutrophils: 49 %
Platelets: 282 10*3/uL (ref 150–450)
RBC: 4.34 x10E6/uL (ref 3.77–5.28)
RDW: 12.1 % (ref 11.7–15.4)
WBC: 6.4 10*3/uL (ref 3.4–10.8)

## 2022-02-20 LAB — COMPREHENSIVE METABOLIC PANEL
ALT: 10 IU/L (ref 0–32)
AST: 15 IU/L (ref 0–40)
Albumin/Globulin Ratio: 1.9 (ref 1.2–2.2)
Albumin: 4.7 g/dL (ref 3.8–4.9)
Alkaline Phosphatase: 90 IU/L (ref 44–121)
BUN/Creatinine Ratio: 16 (ref 9–23)
BUN: 9 mg/dL (ref 6–24)
Bilirubin Total: 0.4 mg/dL (ref 0.0–1.2)
CO2: 24 mmol/L (ref 20–29)
Calcium: 9.6 mg/dL (ref 8.7–10.2)
Chloride: 102 mmol/L (ref 96–106)
Creatinine, Ser: 0.57 mg/dL (ref 0.57–1.00)
Globulin, Total: 2.5 g/dL (ref 1.5–4.5)
Glucose: 81 mg/dL (ref 70–99)
Potassium: 4 mmol/L (ref 3.5–5.2)
Sodium: 141 mmol/L (ref 134–144)
Total Protein: 7.2 g/dL (ref 6.0–8.5)
eGFR: 106 mL/min/{1.73_m2} (ref >59)

## 2022-02-20 LAB — HEMOGLOBIN A1C
Est. average glucose Bld gHb Est-mCnc: 105 mg/dL
Hgb A1c MFr Bld: 5.3 % (ref 4.8–5.6)

## 2022-02-20 LAB — VITAMIN D 25 HYDROXY (VIT D DEFICIENCY, FRACTURES): Vit D, 25-Hydroxy: 45.9 ng/mL (ref 30.0–100.0)

## 2022-02-20 LAB — LIPID PANEL
Chol/HDL Ratio: 2.7 ratio (ref 0.0–4.4)
Cholesterol, Total: 251 mg/dL — ABNORMAL HIGH (ref 100–199)
HDL: 92 mg/dL (ref >39)
LDL Chol Calc (NIH): 146 mg/dL — ABNORMAL HIGH (ref 0–99)
Triglycerides: 81 mg/dL (ref 0–149)
VLDL Cholesterol Cal: 13 mg/dL (ref 5–40)

## 2022-02-25 ENCOUNTER — Encounter (HOSPITAL_BASED_OUTPATIENT_CLINIC_OR_DEPARTMENT_OTHER): Payer: Self-pay | Admitting: Nurse Practitioner

## 2022-02-25 NOTE — Assessment & Plan Note (Signed)
CPE today with no alarm sx present.  Review of medical history and medications today.  Labs will be obtained.  F/U in 1 year or sooner if needed.

## 2022-02-25 NOTE — Assessment & Plan Note (Signed)
Chronic. Controlled with prn maxalt primarily. Relpax if this is ineffective. Will plan f/u PRN for sx.

## 2022-02-25 NOTE — Assessment & Plan Note (Signed)
Chronic. No alarm sx present at this time. Will obtain labs today. F/U if sx worsen or new sx develop.

## 2022-03-11 DIAGNOSIS — S82001A Unspecified fracture of right patella, initial encounter for closed fracture: Secondary | ICD-10-CM | POA: Diagnosis not present

## 2022-03-11 DIAGNOSIS — M25561 Pain in right knee: Secondary | ICD-10-CM | POA: Diagnosis not present

## 2022-04-15 DIAGNOSIS — M25561 Pain in right knee: Secondary | ICD-10-CM | POA: Diagnosis not present

## 2022-06-04 ENCOUNTER — Other Ambulatory Visit (HOSPITAL_BASED_OUTPATIENT_CLINIC_OR_DEPARTMENT_OTHER): Payer: Self-pay | Admitting: Nurse Practitioner

## 2022-06-04 DIAGNOSIS — Z0000 Encounter for general adult medical examination without abnormal findings: Secondary | ICD-10-CM

## 2022-06-05 NOTE — Telephone Encounter (Signed)
Refill request last apt. With you was 02/19/22 and I just sent her a mychart message to schedule an appointment with you we do not have her down for an apt. Here.

## 2022-06-11 MED ORDER — ELETRIPTAN HYDROBROMIDE 40 MG PO TABS: 40.0000 mg | ORAL_TABLET | ORAL | 11 refills | Status: DC | PRN

## 2022-07-16 ENCOUNTER — Encounter: Payer: Self-pay | Admitting: Family Medicine

## 2022-07-16 ENCOUNTER — Encounter: Payer: Self-pay | Admitting: Nurse Practitioner

## 2022-07-16 ENCOUNTER — Telehealth: Payer: BC Managed Care – PPO | Admitting: Family Medicine

## 2022-07-16 VITALS — Temp 99.7°F | Wt 110.0 lb

## 2022-07-16 DIAGNOSIS — J4520 Mild intermittent asthma, uncomplicated: Secondary | ICD-10-CM | POA: Diagnosis not present

## 2022-07-16 DIAGNOSIS — U071 COVID-19: Secondary | ICD-10-CM

## 2022-07-16 MED ORDER — NIRMATRELVIR/RITONAVIR (PAXLOVID)TABLET: 3.0000 | ORAL_TABLET | Freq: Two times a day (BID) | ORAL | 0 refills | Status: AC

## 2022-07-16 NOTE — Progress Notes (Signed)
   Subjective:    Patient ID: Jasmine Herrera, female    DOB: [birth year], 57 y.o.   MRN: [id number]  HPI Documentation for virtual audio and video telecommunications through [place] encounter:  The patient was located at home. 2 patient identifiers used.  The provider was located in the office. The patient did consent to this visit and is aware of possible charges through their insurance for this visit. The other persons participating in this telemedicine service were none. Time spent on call was 5 minutes and in review of previous records >20 minutes total for counseling and coordination of care. This virtual service is not related to other E/M service within previous 7 days.  She states that she woke up this morning with a slightly sore throat and checked for COVID and the test was positive.  She was exposed to it recently and did not wear a mask around that person.  She states that her temperature is 99.7 and she does feel fatigued.  No fever, chills, shortness of breath.  She does work as a Pharmacist, hospital.  Review of Systems     Objective:   Physical Exam Alert and in no distress otherwise not examined       Assessment & Plan:  COVID-19 - Plan: nirmatrelvir/ritonavir (PAXLOVID) 20 x 150 MG & 10 x '100MG'$  TABS  Mild intermittent asthma without complication I explained that she is early into the process and might not necessarily need Paxlovid but I will call it in to be on the safe side.  She is equivocal as to whether she will take it or not.  Recommend Tylenol for the fever aches and pains OTC meds for symptomatic care.  Explained the things to be concerned about would be fever, coughing, shortness of breath.  Explained that once she is feeling better she may return to physical activity but since it so early in the whole process is hard to say which direction she is going to go.  She expressed understanding of this and will call if she has any questions.

## 2022-08-02 DIAGNOSIS — Z01419 Encounter for gynecological examination (general) (routine) without abnormal findings: Secondary | ICD-10-CM | POA: Diagnosis not present

## 2022-08-02 DIAGNOSIS — Z124 Encounter for screening for malignant neoplasm of cervix: Secondary | ICD-10-CM | POA: Diagnosis not present

## 2022-08-02 DIAGNOSIS — Z1231 Encounter for screening mammogram for malignant neoplasm of breast: Secondary | ICD-10-CM | POA: Diagnosis not present

## 2022-08-02 DIAGNOSIS — Z681 Body mass index (BMI) 19 or less, adult: Secondary | ICD-10-CM | POA: Diagnosis not present

## 2022-08-02 LAB — HM MAMMOGRAPHY

## 2022-08-06 LAB — HM PAP SMEAR

## 2022-09-11 ENCOUNTER — Encounter: Payer: Self-pay | Admitting: Nurse Practitioner

## 2022-09-11 DIAGNOSIS — J4520 Mild intermittent asthma, uncomplicated: Secondary | ICD-10-CM

## 2022-09-11 DIAGNOSIS — F419 Anxiety disorder, unspecified: Secondary | ICD-10-CM

## 2022-09-12 MED ORDER — ALBUTEROL SULFATE HFA 108 (90 BASE) MCG/ACT IN AERS: INHALATION_SPRAY | RESPIRATORY_TRACT | 3 refills | Status: DC

## 2022-09-12 MED ORDER — ALPRAZOLAM 0.25 MG PO TABS: 0.2500 mg | ORAL_TABLET | Freq: Two times a day (BID) | ORAL | 1 refills | Status: DC | PRN

## 2023-02-25 ENCOUNTER — Encounter: Payer: Self-pay | Admitting: Nurse Practitioner

## 2023-03-10 ENCOUNTER — Encounter: Payer: Self-pay | Admitting: Nurse Practitioner

## 2023-03-10 ENCOUNTER — Ambulatory Visit: Payer: BC Managed Care – PPO | Admitting: Nurse Practitioner

## 2023-03-10 VITALS — BP 122/80 | HR 70 | Ht 64.25 in | Wt 111.6 lb

## 2023-03-10 DIAGNOSIS — N951 Menopausal and female climacteric states: Secondary | ICD-10-CM

## 2023-03-10 DIAGNOSIS — H60312 Diffuse otitis externa, left ear: Secondary | ICD-10-CM | POA: Diagnosis not present

## 2023-03-10 DIAGNOSIS — L732 Hidradenitis suppurativa: Secondary | ICD-10-CM | POA: Diagnosis not present

## 2023-03-10 DIAGNOSIS — Z114 Encounter for screening for human immunodeficiency virus [HIV]: Secondary | ICD-10-CM

## 2023-03-10 DIAGNOSIS — Z1159 Encounter for screening for other viral diseases: Secondary | ICD-10-CM

## 2023-03-10 DIAGNOSIS — M810 Age-related osteoporosis without current pathological fracture: Secondary | ICD-10-CM

## 2023-03-10 DIAGNOSIS — Z0000 Encounter for general adult medical examination without abnormal findings: Secondary | ICD-10-CM

## 2023-03-10 HISTORY — DX: Diffuse otitis externa, left ear: H60.312

## 2023-03-10 MED ORDER — CIPROFLOXACIN HCL 0.3 % OP SOLN: OPHTHALMIC | 0 refills | Status: AC

## 2023-03-10 MED ORDER — HYDROCORTISONE-ACETIC ACID 1-2 % OT SOLN: 4.0000 [drp] | Freq: Three times a day (TID) | OTIC | 3 refills | Status: DC

## 2023-03-10 NOTE — Assessment & Plan Note (Signed)
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 -Continue L-lysine supplementation. -Consider hormone and allergen testing to identify potential triggers.

## 2023-03-10 NOTE — Progress Notes (Signed)
Shawna Clamp, DNP, AGNP-c [facility] [address] [place], [place] [address] Main Office [phone]  BP 122/80   Pulse 70   Ht 5' 4.25" (1.632 m)   Wt 111 lb 9.6 oz (50.6 kg)   LMP 04/17/2015 Comment: irreg  BMI 19.01 kg/m    Subjective:    Patient ID: Jasmine Herrera, female    DOB: 1965-11-15, 57 y.o.   MRN: [id number]  HPI: Jasmine Herrera is a 57 y.o. female presenting on 03/10/2023 for comprehensive medical examination.   History of Present Illness Jasmine Herrera has a history of eczema and hyperimmune skin conditions, of which affect the ears. She presents with a chief complaint of a left sided ear infection. The patient has been self-managing eczema in the ear canals for several years with Nizoral and a dermatitis cream, which had been effective until recently. The patient reports a flare-up of symptoms, including severe itching and weeping, following a period of dietary indiscretion. The patient also notes that the ear canal has become almost completely closed due to swelling.  In addition to the ear infection, the patient reports a new skin issue that emerged over the past year, characterized by recurrent abscesses in the armpits. This condition, which the patient identifies as hidradenitis suppurativa (HS), was active for about seven months, with abscesses alternating between armpits. The patient has been managing this condition with L-lysine, which appears to have put the condition into remission. The patient also mentions using a special soap for folliculitis and avoiding shaving to manage this condition.  The patient expresses concern about potential food sensitivities or other triggers for these hyperimmune skin conditions and is interested in exploring potential causes. The patient reports a history of environmental allergies but notes that these have been well-controlled recently.    The patient also mentions a history of osteopenia and is currently taking  vitamin D and a multivitamin for bone health. She does not wish to take treatment for this condition at this time, therefore she has been postponing a follow-up DEXA.  The patient has been managing menopausal symptoms, including hot flashes, and has considered hormone replacement therapy (HRT) for both symptom management and potential benefits for bone health. However, the patient has not yet pursued HRT due to other health concerns. The patient also reports a history of cysts and boils in various locations, including the groin and head, but these have not been a recent issue.   Pertinent items are noted in HPI.  IMMUNIZATIONS:   Flu Vaccine: Flu vaccine declined, patient will complete later Prevnar 13: Prevnar 13 N/A for this patient Prevnar 20: Prevnar 20 N/A for this patient Pneumovax 23: Pneumovax 23 N/A for this patient Vac Shingrix: Shingrix declined, patient will complete at a later date HPV: N/A or Aged Out Tetanus: Tetanus completed in the last 10 years COVID: Declined today. Information on where to obtain the vaccine was provided.  RSV: No  HEALTH MAINTENANCE: Pap Smear HM Status: is up to date Mammogram HM Status: is up to date Colon Cancer Screening HM Status: is up to date Bone Density HM Status: due STI Testing HM Status: N/A Lung CT HM Status: N/A  Concerns with vision, hearing, or dentition: No  Most Recent Depression Screen:     03/10/2023   10:12 AM 07/28/2017    3:07 PM 11/05/2016    1:44 PM 08/21/2016    2:11 PM 01/12/2016    3:48 PM  Depression screen PHQ 2/9  Decreased Interest 1 0 0  0 0  Down, Depressed, Hopeless 1 0 0 0 1  PHQ - 2 Score 2 0 0 0 1  Altered sleeping 1 0 0 0   Tired, decreased energy 1 0 0 0   Change in appetite 0 0 0 0   Feeling bad or failure about yourself  0 0 0 0   Trouble concentrating 0 0 0 0   Moving slowly or fidgety/restless 0 0 0 0   Suicidal thoughts 0 0 0 0   PHQ-9 Score 4 0 0 0   Difficult doing work/chores Not difficult  at all Not difficult at all      Most Recent Anxiety Screen:      No data to display         Most Recent Fall Screen:    03/10/2023   10:11 AM 07/28/2017    3:07 PM 01/12/2016    3:48 PM 03/28/2015   10:59 AM  Fall Risk   Falls in the past year? 1 No No No  Number falls in past yr: 0     Injury with Fall? 1     Comment cracked knee cap last Oct.     Risk for fall due to : No Fall Risks     Follow up Falls evaluation completed       Past medical history, surgical history, medications, allergies, family history and social history reviewed with patient today and changes made to appropriate areas of the chart.  Past Medical History:  Past Medical History:  Diagnosis Date   Allergy    Anxiety    Asthma    Depression    Migraine    Osteoporosis    no meds   Medications:  Current Outpatient Medications on File Prior to Visit  Medication Sig   albuterol (PROAIR HFA) 108 (90 Base) MCG/ACT inhaler INHALE 2 PUFFS BY MOUTH EVERY 6 HOURS AS NEEDED FOR WHEEZE OR SHORTNESS OF BREATH   ALPRAZolam (XANAX) 0.25 MG tablet Take 1 tablet (0.25 mg total) by mouth 2 (two) times daily as needed for anxiety.   Cholecalciferol (VITAMIN D PO) Take 5,000 mg by mouth daily.   eletriptan (RELPAX) 40 MG tablet Take 1 tablet (40 mg total) by mouth as needed for migraine or headache. If no response to rizatriptan after 2 hours. No more than 1 dose in 24 hours.   FLUoxetine (PROZAC) 20 MG capsule Take 1 capsule (20 mg total) by mouth daily. Schedule follow up appointment for mood for more refills   Multiple Vitamins-Minerals (B COMPLEX PLUS VITAMIN C PO) Take 1 capsule by mouth daily.   rizatriptan (MAXALT) 10 MG tablet Take 1 tablet (10 mg total) by mouth as needed for migraine (may repeat dose 1 time after 2 hours if no improvement.).   Fluticasone-Salmeterol (ADVAIR DISKUS) 250-50 MCG/DOSE AEPB Inhale 1 puff into the lungs 2 (two) times daily. (Patient not taking: Reported on 07/16/2022)   No current  facility-administered medications on file prior to visit.   Surgical History:  Past Surgical History:  Procedure Laterality Date   FRACTURE SURGERY  1983   left femur   NASAL SEPTOPLASTY W/ TURBINOPLASTY Bilateral 04/17/2015   Procedure: NASAL SEPTOPLASTY WITH TURBINATE REDUCTION;  Surgeon: Flo Shanks, MD;  Location: [facility];  Service: ENT;  Laterality: Bilateral;   NASAL SINUS SURGERY Left 04/17/2015   Procedure: ENDOSCOPIC REMOVAL ANTROCHOANAL POLYP;  Surgeon: Flo Shanks, MD;  Location: [facility];  Service: ENT;  Laterality: Left;  TONSILLECTOMY AND ADENOIDECTOMY     WISDOM TOOTH EXTRACTION     Allergies:  Allergies  Allergen Reactions   Penicillins Other (See Comments)    Unsure reaction as infant   Beeswax Rash   Family History:  Family History  Adopted: Yes  Family history unknown: Yes       Objective:    BP 122/80   Pulse 70   Ht 5' 4.25" (1.632 m)   Wt 111 lb 9.6 oz (50.6 kg)   LMP 04/17/2015 Comment: irreg  BMI 19.01 kg/m   Wt Readings from Last 3 Encounters:  03/10/23 111 lb 9.6 oz (50.6 kg)  07/16/22 110 lb (49.9 kg)  02/19/22 108 lb 6.4 oz (49.2 kg)    Physical Exam Vitals and nursing note reviewed.  Constitutional:      General: She is not in acute distress.    Appearance: Normal appearance.  HENT:     Head: Normocephalic and atraumatic.     Right Ear: Hearing, tympanic membrane, ear canal and external ear normal.     Left Ear: Hearing normal. Drainage, swelling and tenderness present.     Ears:     Comments: Otitis externa present.     Nose: Nose normal.     Right Sinus: No maxillary sinus tenderness or frontal sinus tenderness.     Left Sinus: No maxillary sinus tenderness or frontal sinus tenderness.     Mouth/Throat:     Lips: Pink.     Mouth: Mucous membranes are moist.     Pharynx: Oropharynx is clear.  Eyes:     General: Lids are normal. Vision grossly intact.     Extraocular Movements: Extraocular  movements intact.     Conjunctiva/sclera: Conjunctivae normal.     Pupils: Pupils are equal, round, and reactive to light.     Funduscopic exam:    Right eye: Red reflex present.        Left eye: Red reflex present.    Visual Fields: Right eye visual fields normal and left eye visual fields normal.  Neck:     Thyroid: No thyromegaly.     Vascular: No carotid bruit.  Cardiovascular:     Rate and Rhythm: Normal rate and regular rhythm.     Chest Wall: PMI is not displaced.     Pulses: Normal pulses.          Dorsalis pedis pulses are 2+ on the right side and 2+ on the left side.       Posterior tibial pulses are 2+ on the right side and 2+ on the left side.     Heart sounds: Normal heart sounds. No murmur heard. Pulmonary:     Effort: Pulmonary effort is normal. No respiratory distress.     Breath sounds: Normal breath sounds.  Abdominal:     General: Abdomen is flat. Bowel sounds are normal. There is no distension.     Palpations: Abdomen is soft. There is no hepatomegaly, splenomegaly or mass.     Tenderness: There is no abdominal tenderness. There is no right CVA tenderness, left CVA tenderness, guarding or rebound.  Musculoskeletal:        General: Normal range of motion.     Cervical back: Full passive range of motion without pain, normal range of motion and neck supple. No tenderness.     Right lower leg: No edema.     Left lower leg: No edema.  Feet:     Left foot:  Toenail Condition: Left toenails are normal.  Lymphadenopathy:     Cervical: No cervical adenopathy.     Upper Body:     Right upper body: No supraclavicular adenopathy.     Left upper body: No supraclavicular adenopathy.  Skin:    General: Skin is warm and dry.     Capillary Refill: Capillary refill takes less than 2 seconds.     Nails: There is no clubbing.     Comments: Axillary scarring bilaterally consistent with healed HS abscesses  Neurological:     General: No focal deficit present.     Mental  Status: She is alert and oriented to person, place, and time.     GCS: GCS eye subscore is 4. GCS verbal subscore is 5. GCS motor subscore is 6.     Sensory: Sensation is intact.     Motor: Motor function is intact.     Coordination: Coordination is intact.     Gait: Gait is intact.     Deep Tendon Reflexes: Reflexes are normal and symmetric.  Psychiatric:        Attention and Perception: Attention normal.        Mood and Affect: Mood normal.        Speech: Speech normal.        Behavior: Behavior normal. Behavior is cooperative.        Thought Content: Thought content normal.        Cognition and Memory: Cognition and memory normal.        Judgment: Judgment normal.      Results for orders placed or performed in visit on 02/19/22  CBC With Diff/Platelet  Result Value Ref Range   WBC 6.4 3.4 - 10.8 x10E3/uL   RBC 4.34 3.77 - 5.28 x10E6/uL   Hemoglobin 13.2 11.1 - 15.9 g/dL   Hematocrit 75.6 43.3 - 46.6 %   MCV 94 79 - 97 fL   MCH 30.4 26.6 - 33.0 pg   MCHC 32.3 31.5 - 35.7 g/dL   RDW 29.5 18.8 - 41.6 %   Platelets 282 150 - 450 x10E3/uL   Neutrophils 49 Not Estab. %   Lymphs 35 Not Estab. %   Monocytes 10 Not Estab. %   Eos 5 Not Estab. %   Basos 1 Not Estab. %   Neutrophils Absolute 3.2 1.4 - 7.0 x10E3/uL   Lymphocytes Absolute 2.3 0.7 - 3.1 x10E3/uL   Monocytes Absolute 0.6 0.1 - 0.9 x10E3/uL   EOS (ABSOLUTE) 0.3 0.0 - 0.4 x10E3/uL   Basophils Absolute 0.1 0.0 - 0.2 x10E3/uL   Immature Granulocytes 0 Not Estab. %   Immature Grans (Abs) 0.0 0.0 - 0.1 x10E3/uL  Comprehensive metabolic panel  Result Value Ref Range   Glucose 81 70 - 99 mg/dL   BUN 9 6 - 24 mg/dL   Creatinine, Ser 6.06 0.57 - 1.00 mg/dL   eGFR 301 >60 FU/XNA/3.55   BUN/Creatinine Ratio 16 9 - 23   Sodium 141 134 - 144 mmol/L   Potassium 4.0 3.5 - 5.2 mmol/L   Chloride 102 96 - 106 mmol/L   CO2 24 20 - 29 mmol/L   Calcium 9.6 8.7 - 10.2 mg/dL   Total Protein 7.2 6.0 - 8.5 g/dL   Albumin 4.7 3.8 -  4.9 g/dL   Globulin, Total 2.5 1.5 - 4.5 g/dL   Albumin/Globulin Ratio 1.9 1.2 - 2.2   Bilirubin Total 0.4 0.0 - 1.2 mg/dL   Alkaline Phosphatase 90 44 - 121  IU/L   AST 15 0 - 40 IU/L   ALT 10 0 - 32 IU/L  Hemoglobin A1c  Result Value Ref Range   Hgb A1c MFr Bld 5.3 4.8 - 5.6 %   Est. average glucose Bld gHb Est-mCnc 105 mg/dL  VITAMIN D 25 Hydroxy (Vit-D Deficiency, Fractures)  Result Value Ref Range   Vit D, 25-Hydroxy 45.9 30.0 - 100.0 ng/mL  Lipid panel  Result Value Ref Range   Cholesterol, Total 251 (H) 100 - 199 mg/dL   Triglycerides 81 0 - 149 mg/dL   HDL 92 >40 mg/dL   VLDL Cholesterol Cal 13 5 - 40 mg/dL   LDL Chol Calc (NIH) 981 (H) 0 - 99 mg/dL   Chol/HDL Ratio 2.7 0.0 - 4.4 ratio       Assessment & Plan:   Problem List Items Addressed This Visit     Encounter for annual physical exam - Primary    CPE completed today. Review of HM activities and recommendations discussed and provided on AVS. Anticipatory guidance, diet, and exercise recommendations provided. Medications, allergies, and hx reviewed and updated as necessary. Orders placed as listed below.  Plan: - Labs ordered. Will make changes as necessary based on results.  - I will review these results and send recommendations via MyChart or a telephone call.  - F/U with CPE in 1 year or sooner for acute/chronic health needs as directed.        Relevant Medications   acetic acid-hydrocortisone (VOSOL-HC) OTIC solution   Other Relevant Orders   CBC with Differential/Platelet   CMP14+EGFR   Hemoglobin A1c   Lipid panel   Hepatitis C antibody   HIV Antibody (routine testing w rflx)   Menopause syndrome    Patient experiencing hot flashes and sleep disturbances. -Consider hormone replacement therapy for symptom management and bone health.      Relevant Orders   Testosterone Free with SHBG   Estradiol   Osteoporosis    Patient has a history of  osteoporosis noted in the chart, but reported osteopenia.  Will review further to clarify. Currently managing with vitamin D, calcium, and a multivitamin for bone health. -Continue current management strategy. -Strongly recommend weight-bearing exercises for bone health.      Hidradenitis axillaris    Recurrent abscesses in axillary region, currently in remission due to L-lysine supplementation. -Continue L-lysine supplementation. -Consider hormone and allergen testing to identify potential triggers.       Relevant Orders   Insulin, Free and Total   Testosterone Free with SHBG   Allergen Profile, Basic   Estradiol   Acute diffuse otitis externa of left ear    Chronic eczema in ear canals, left ear currently flared with signs of infection. Patient has been self-treating with Nizoral and dermatitis cream. The canal is narrowed due to inflammation. Given the symptoms, recommend prompt treatment with antibiotic drop and cortisone therapy.  -Prescribe acetic acid and hydrocortisone drops. Use for acute infection and as maintenance. -Cipro antibiotic drops for infection.      Relevant Medications   acetic acid-hydrocortisone (VOSOL-HC) OTIC solution   ciprofloxacin (CILOXAN) 0.3 % ophthalmic solution   Other Visit Diagnoses     Screening for HIV without presence of risk factors       Relevant Orders   HIV Antibody (routine testing w rflx)   Encounter for hepatitis C screening test for low risk patient       Relevant Orders   Hepatitis C antibody  Follow up plan: Return in about 1 year (around 03/09/2024) for CPE-- Flu and COVID vaccines on Friday.  NEXT PREVENTATIVE PHYSICAL DUE IN 1 YEAR.  PATIENT COUNSELING PROVIDED FOR ALL ADULT PATIENTS: A well balanced diet low in saturated fats, cholesterol, and moderation in carbohydrates.  This can be as simple as monitoring portion sizes and cutting back on sugary beverages such as soda and juice to start with.    Daily water consumption of at least 64 ounces.  Physical activity  at least 180 minutes per week.  If just starting out, start 10 minutes a day and work your way up.   This can be as simple as taking the stairs instead of the elevator and walking 2-3 laps around the office  purposefully every day.   STD protection, partner selection, and regular testing if high risk.  Limited consumption of alcoholic beverages if alcohol is consumed. For men, I recommend no more than 14 alcoholic beverages per week, spread out throughout the week (max 2 per day). Avoid "binge" drinking or consuming large quantities of alcohol in one setting.  Please let me know if you feel you may need help with reduction or quitting alcohol consumption.   Avoidance of nicotine, if used. Please let me know if you feel you may need help with reduction or quitting nicotine use.   Daily mental health attention. This can be in the form of 5 minute daily meditation, prayer, journaling, yoga, reflection, etc.  Purposeful attention to your emotions and mental state can significantly improve your overall wellbeing  and  Health.  Please know that I am here to help you with all of your health care goals and am happy to work with you to find a solution that works best for you.  The greatest advice I have received with any changes in life are to take it one step at a time, that even means if all you can focus on is the next 60 seconds, then do that and celebrate your victories.  With any changes in life, you will have set backs, and that is OK. The important thing to remember is, if you have a set back, it is not a failure, it is an opportunity to try again! Screening Testing Mammogram Every 1 -2 years based on history and risk factors Starting at age 58 Pap Smear Ages 21-39 every 3 years Ages 34-65 every 5 years with HPV testing More frequent testing may be required based on results and history Colon Cancer Screening Every 1-10 years based on test performed, risk factors, and history Starting at  age 50 Bone Density Screening Every 2-10 years based on history Starting at age 72 for women Recommendations for men differ based on medication usage, history, and risk factors AAA Screening One time ultrasound Men 62-24 years old who have every smoked Lung Cancer Screening Low Dose Lung CT every 12 months Age 24-80 years with a 30 pack-year smoking history who still smoke or who have quit within the last 15 years   Screening Labs Routine  Labs: Complete Blood Count (CBC), Complete Metabolic Panel (CMP), Cholesterol (Lipid Panel) Every 6-12 months based on history and medications May be recommended more frequently based on current conditions or previous results Hemoglobin A1c Lab Every 3-12 months based on history and previous results Starting at age 69 or earlier with diagnosis of diabetes, high cholesterol, BMI >26, and/or risk factors Frequent monitoring for patients with diabetes to ensure blood sugar control Thyroid Panel (TSH) Every  6 months based on history, symptoms, and risk factors May be repeated more often if on medication HIV One time testing for all patients 31 and older May be repeated more frequently for patients with increased risk factors or exposure Hepatitis C One time testing for all patients 55 and older May be repeated more frequently for patients with increased risk factors or exposure Gonorrhea, Chlamydia Every 12 months for all sexually active persons 13-24 years Additional monitoring may be recommended for those who are considered high risk or who have symptoms Every 12 months for any woman on birth control, regardless of sexual activity PSA Men 35-58 years old with risk factors Additional screening may be recommended from age 65-69 based on risk factors, symptoms, and history  Vaccine Recommendations Tetanus Booster All adults every 10 years Flu Vaccine All patients 6 months and older every year COVID Vaccine All patients 12 years and  older Initial dosing with booster May recommend additional booster based on age and health history HPV Vaccine 2 doses all patients age 75-26 Dosing may be considered for patients over 26 Shingles Vaccine (Shingrix) 2 doses all adults 55 years and older Pneumonia (Pneumovax 80) All adults 65 years and older May recommend earlier dosing based on health history One year apart from Prevnar 49 Pneumonia (Prevnar 17) All adults 65 years and older Dosed 1 year after Pneumovax 23 Pneumonia (Prevnar 20) One time alternative to the two dosing of 13 and 23 For all adults with initial dose of 23, 20 is recommended 1 year later For all adults with initial dose of 13, 23 is still recommended as second option 1 year later

## 2023-03-10 NOTE — Assessment & Plan Note (Signed)
Chronic eczema in ear canals, left ear currently flared with signs of infection. Patient has been self-treating with Nizoral and dermatitis cream. The canal is narrowed due to inflammation. Given the symptoms, recommend prompt treatment with antibiotic drop and cortisone therapy.  -Prescribe acetic acid and hydrocortisone drops. Use for acute infection and as maintenance. -Cipro antibiotic drops for infection.

## 2023-03-10 NOTE — Assessment & Plan Note (Addendum)
Patient has a history of  osteoporosis noted in the chart, but reported osteopenia. Will review further to clarify. Currently managing with vitamin D, calcium, and a multivitamin for bone health. -Continue current management strategy. -Strongly recommend weight-bearing exercises for bone health.

## 2023-03-10 NOTE — Assessment & Plan Note (Signed)
Patient experiencing hot flashes and sleep disturbances. -Consider hormone replacement therapy for symptom management and bone health.

## 2023-03-10 NOTE — Patient Instructions (Addendum)
We will get the records from [facility] to update your records.  I have ordered a food allergy profile to see if there are any common allergens that could be triggering the HS symptoms.  I have also ordered the testosterone, estrogen, FSH/LH levels and insulin level to check and see if this is contributing to the symptoms.   I sent in drops for your ears. You can continue to use the acetic acid-hydrocortisone after your ear improves as a maintenance. You can use this with the ketoconazole shampoo to wash the ears out. This will be helpful in preventing recurrence of infection.  I also sent in the antibiotic drops to use for short term.   Please let me know if this is not better, though.

## 2023-03-10 NOTE — Assessment & Plan Note (Signed)

## 2023-03-14 ENCOUNTER — Other Ambulatory Visit (INDEPENDENT_AMBULATORY_CARE_PROVIDER_SITE_OTHER): Payer: BC Managed Care – PPO

## 2023-03-14 DIAGNOSIS — Z23 Encounter for immunization: Secondary | ICD-10-CM

## 2023-03-21 LAB — HEMOGLOBIN A1C
Est. average glucose Bld gHb Est-mCnc: 108 mg/dL
Hgb A1c MFr Bld: 5.4 % (ref 4.8–5.6)

## 2023-03-21 LAB — TESTOSTERONE, FREE AND TOTAL (INCLUDES SHBG)-(MALES)
% Free Testosterone: 0.8 %
Free Testosterone, S: 0.7 pg/mL — ABNORMAL LOW
Sex Hormone Binding Globulin: 94 nmol/L
Testosterone, Serum (Total): 9.1 ng/dL

## 2023-03-21 LAB — LIPID PANEL
Chol/HDL Ratio: 3 ratio (ref 0.0–4.4)
Cholesterol, Total: 255 mg/dL — ABNORMAL HIGH (ref 100–199)
HDL: 84 mg/dL (ref >39)
LDL Chol Calc (NIH): 154 mg/dL — ABNORMAL HIGH (ref 0–99)
Triglycerides: 101 mg/dL (ref 0–149)
VLDL Cholesterol Cal: 17 mg/dL (ref 5–40)

## 2023-03-21 LAB — ALLERGEN PROFILE, BASIC
Codfish IgE: <0.1 kU/L
Egg White IgE: 0.62 kU/L — AB
Milk IgE: <0.1 kU/L
Peanut IgE: <0.1 kU/L
Soybean IgE: <0.1 kU/L
Wheat IgE: <0.1 kU/L

## 2023-03-21 LAB — CBC WITH DIFFERENTIAL/PLATELET
Basophils Absolute: 0.1 10*3/uL (ref 0.0–0.2)
Basos: 1 %
EOS (ABSOLUTE): 0.3 10*3/uL (ref 0.0–0.4)
Eos: 5 %
Hematocrit: 39.8 % (ref 34.0–46.6)
Hemoglobin: 13 g/dL (ref 11.1–15.9)
Immature Grans (Abs): 0 10*3/uL (ref 0.0–0.1)
Immature Granulocytes: 0 %
Lymphocytes Absolute: 1.7 10*3/uL (ref 0.7–3.1)
Lymphs: 26 %
MCH: 30.4 pg (ref 26.6–33.0)
MCHC: 32.7 g/dL (ref 31.5–35.7)
MCV: 93 fL (ref 79–97)
Monocytes Absolute: 0.6 10*3/uL (ref 0.1–0.9)
Monocytes: 9 %
Neutrophils Absolute: 3.9 10*3/uL (ref 1.4–7.0)
Neutrophils: 59 %
Platelets: 313 10*3/uL (ref 150–450)
RBC: 4.27 x10E6/uL (ref 3.77–5.28)
RDW: 12.4 % (ref 11.7–15.4)
WBC: 6.6 10*3/uL (ref 3.4–10.8)

## 2023-03-21 LAB — CMP14+EGFR
ALT: 10 [IU]/L (ref 0–32)
AST: 16 [IU]/L (ref 0–40)
Albumin: 4.5 g/dL (ref 3.8–4.9)
Alkaline Phosphatase: 88 [IU]/L (ref 44–121)
BUN/Creatinine Ratio: 20 (ref 9–23)
BUN: 11 mg/dL (ref 6–24)
Bilirubin Total: 0.4 mg/dL (ref 0.0–1.2)
CO2: 24 mmol/L (ref 20–29)
Calcium: 10 mg/dL (ref 8.7–10.2)
Chloride: 100 mmol/L (ref 96–106)
Creatinine, Ser: 0.54 mg/dL — ABNORMAL LOW (ref 0.57–1.00)
Globulin, Total: 2.6 g/dL (ref 1.5–4.5)
Glucose: 82 mg/dL (ref 70–99)
Potassium: 4.4 mmol/L (ref 3.5–5.2)
Sodium: 139 mmol/L (ref 134–144)
Total Protein: 7.1 g/dL (ref 6.0–8.5)
eGFR: 107 mL/min/{1.73_m2} (ref >59)

## 2023-03-21 LAB — HIV ANTIBODY (ROUTINE TESTING W REFLEX): HIV Screen 4th Generation wRfx: NONREACTIVE

## 2023-03-21 LAB — INSULIN, FREE AND TOTAL
Free Insulin: 7.1 uU/mL
Total Insulin: 7.1 uU/mL

## 2023-03-21 LAB — HEPATITIS C ANTIBODY: Hep C Virus Ab: NONREACTIVE

## 2023-03-21 LAB — ESTRADIOL: Estradiol: <5 pg/mL

## 2023-04-03 ENCOUNTER — Encounter: Payer: Self-pay | Admitting: Nurse Practitioner

## 2023-04-09 ENCOUNTER — Other Ambulatory Visit: Payer: Self-pay | Admitting: Nurse Practitioner

## 2023-04-09 DIAGNOSIS — F419 Anxiety disorder, unspecified: Secondary | ICD-10-CM

## 2023-04-09 MED ORDER — ALPRAZOLAM 0.25 MG PO TABS: 0.2500 mg | ORAL_TABLET | Freq: Two times a day (BID) | ORAL | 2 refills | Status: DC | PRN

## 2023-06-04 ENCOUNTER — Encounter: Payer: Self-pay | Admitting: Nurse Practitioner

## 2023-06-29 ENCOUNTER — Other Ambulatory Visit (HOSPITAL_BASED_OUTPATIENT_CLINIC_OR_DEPARTMENT_OTHER): Payer: Self-pay | Admitting: Nurse Practitioner

## 2023-06-29 DIAGNOSIS — Z0000 Encounter for general adult medical examination without abnormal findings: Secondary | ICD-10-CM

## 2023-07-21 ENCOUNTER — Other Ambulatory Visit: Payer: Self-pay | Admitting: Nurse Practitioner

## 2023-07-21 ENCOUNTER — Other Ambulatory Visit: Payer: Self-pay | Admitting: Family Medicine

## 2023-07-21 DIAGNOSIS — F418 Other specified anxiety disorders: Secondary | ICD-10-CM

## 2023-07-21 DIAGNOSIS — F419 Anxiety disorder, unspecified: Secondary | ICD-10-CM

## 2023-07-21 NOTE — Telephone Encounter (Signed)
 Copied from CRM [id number]. Topic: Clinical - Medication Refill >> Jul 21, 2023  1:37 PM Patsy Lager T wrote: Most Recent Primary Care Visit:  Provider: [facility]  Department: [facility]  Visit Type: LAB  Date: 03/14/2023  Medication: FLUoxetine (PROZAC) 20 MG capsule  Has the patient contacted their pharmacy? No  Is this the correct pharmacy for this prescription? Yes  This is the patient's preferred pharmacy:  [id number] [place], [place] - [address] [place] [address] Phone: [phone] Fax: [phone]  Has the prescription been filled recently? Yes  Is the patient out of the medication? Yes  Has the patient been seen for an appointment in the last year OR does the patient have an upcoming appointment? Yes  Can we respond through MyChart? Yes  Agent: Please be advised that Rx refills may take up to 3 business days. We ask that you follow-up with your pharmacy.  Insurance will cover 90 day supply

## 2023-07-21 NOTE — Telephone Encounter (Signed)
 Patient called back and said she is on her last pill and you know her history.

## 2023-07-21 NOTE — Telephone Encounter (Signed)
 Left message for patient. Need clarification-appears last time this medication was filled was in 2020 by Dr. Woodroe Chen.

## 2023-07-22 MED ORDER — FLUOXETINE HCL 20 MG PO CAPS: 20.0000 mg | ORAL_CAPSULE | Freq: Every day | ORAL | 3 refills | Status: DC

## 2023-08-13 ENCOUNTER — Other Ambulatory Visit: Payer: Self-pay | Admitting: Nurse Practitioner

## 2023-08-13 DIAGNOSIS — F418 Other specified anxiety disorders: Secondary | ICD-10-CM

## 2023-08-13 DIAGNOSIS — F419 Anxiety disorder, unspecified: Secondary | ICD-10-CM

## 2023-11-28 ENCOUNTER — Encounter: Payer: Self-pay | Admitting: Nurse Practitioner

## 2023-11-28 DIAGNOSIS — Z01419 Encounter for gynecological examination (general) (routine) without abnormal findings: Secondary | ICD-10-CM | POA: Diagnosis not present

## 2023-11-28 DIAGNOSIS — Z681 Body mass index (BMI) 19 or less, adult: Secondary | ICD-10-CM | POA: Diagnosis not present

## 2023-11-28 DIAGNOSIS — Z124 Encounter for screening for malignant neoplasm of cervix: Secondary | ICD-10-CM | POA: Diagnosis not present

## 2023-12-02 ENCOUNTER — Encounter: Payer: Self-pay | Admitting: Obstetrics and Gynecology

## 2024-03-08 DIAGNOSIS — Z1231 Encounter for screening mammogram for malignant neoplasm of breast: Secondary | ICD-10-CM | POA: Diagnosis not present

## 2024-03-10 ENCOUNTER — Encounter: Payer: Self-pay | Admitting: Nurse Practitioner

## 2024-03-10 ENCOUNTER — Ambulatory Visit: Payer: BC Managed Care – PPO | Admitting: Nurse Practitioner

## 2024-03-10 VITALS — BP 118/80 | HR 70 | Ht 64.0 in | Wt 113.4 lb

## 2024-03-10 DIAGNOSIS — J4520 Mild intermittent asthma, uncomplicated: Secondary | ICD-10-CM | POA: Diagnosis not present

## 2024-03-10 DIAGNOSIS — E559 Vitamin D deficiency, unspecified: Secondary | ICD-10-CM | POA: Diagnosis not present

## 2024-03-10 DIAGNOSIS — L732 Hidradenitis suppurativa: Secondary | ICD-10-CM | POA: Diagnosis not present

## 2024-03-10 DIAGNOSIS — E538 Deficiency of other specified B group vitamins: Secondary | ICD-10-CM | POA: Diagnosis not present

## 2024-03-10 DIAGNOSIS — Z0000 Encounter for general adult medical examination without abnormal findings: Secondary | ICD-10-CM

## 2024-03-10 DIAGNOSIS — H608X3 Other otitis externa, bilateral: Secondary | ICD-10-CM

## 2024-03-10 DIAGNOSIS — J302 Other seasonal allergic rhinitis: Secondary | ICD-10-CM

## 2024-03-10 DIAGNOSIS — F418 Other specified anxiety disorders: Secondary | ICD-10-CM | POA: Diagnosis not present

## 2024-03-10 DIAGNOSIS — N393 Stress incontinence (female) (male): Secondary | ICD-10-CM

## 2024-03-10 DIAGNOSIS — F419 Anxiety disorder, unspecified: Secondary | ICD-10-CM

## 2024-03-10 DIAGNOSIS — G43009 Migraine without aura, not intractable, without status migrainosus: Secondary | ICD-10-CM

## 2024-03-10 MED ORDER — ALPRAZOLAM 0.25 MG PO TABS: 0.2500 mg | ORAL_TABLET | Freq: Two times a day (BID) | ORAL | 2 refills | Status: AC | PRN

## 2024-03-10 MED ORDER — VENTOLIN HFA 108 (90 BASE) MCG/ACT IN AERS
1.0000 | INHALATION_SPRAY | Freq: Four times a day (QID) | RESPIRATORY_TRACT | 3 refills | Status: AC | PRN
Start: 2024-03-10 — End: ?

## 2024-03-10 MED ORDER — RIZATRIPTAN BENZOATE 10 MG PO TABS: 10.0000 mg | ORAL_TABLET | ORAL | 5 refills | Status: AC | PRN

## 2024-03-10 MED ORDER — FLUOXETINE HCL 20 MG PO CAPS: 20.0000 mg | ORAL_CAPSULE | Freq: Every day | ORAL | 3 refills | Status: AC

## 2024-03-10 MED ORDER — HYDROCORTISONE-ACETIC ACID 1-2 % OT SOLN: 4.0000 [drp] | Freq: Three times a day (TID) | OTIC | 3 refills | Status: AC

## 2024-03-10 MED ORDER — ELETRIPTAN HYDROBROMIDE 40 MG PO TABS: 40.0000 mg | ORAL_TABLET | ORAL | 5 refills | Status: AC | PRN

## 2024-03-10 NOTE — Progress Notes (Signed)
 [name], DNP, AGNP-c [facility] [address] [place], [place] [address] Main Office [phone] VISIT TYPE: CPE on 03/10/2024 Today's Vitals   03/10/24 0834  BP: 118/80  Pulse: 70  Weight: 113 lb 6.4 oz (51.4 kg)  Height: 5' 4 (1.626 m)   Body mass index is 19.47 kg/m. BP 118/80   Pulse 70   Ht 5' 4 (1.626 m)   Wt 113 lb 6.4 oz (51.4 kg)   LMP 04/17/2015 Comment: irreg  BMI 19.47 kg/m   Subjective:    Patient ID: Jasmine Herrera, female    DOB: 1966-03-26, 58 y.o.   MRN: [id number]  HPI: History of Present Illness Jasmine Herrera is a 58 year old female who presents for her annual physical exam.  She had her annual pelvic exam in July, but her mammogram was delayed due to scheduling issues and was completed on Monday.  She has not experienced any recent breakouts in her armpits and continues to take magnesium glycinate before bed. She has also started taking progesterone before bed, along with using an estradiol  patch. There is a slight decrease in migraines since starting these medications.  She is trying to increase her physical activity, motivated by her upcoming 58th birthday, and has been engaging in activities like hiking and kayaking.  Her asthma is well-controlled, with only occasional use of her albuterol  inhaler, which she refills approximately once a year. She also uses Nizoral and acetic acid  and hydrocortisone  for dermatitis in her ears, which helps manage her symptoms.  She experiences anxiety, which she manages by avoiding distressing news.   Her migraines are generally well-controlled with rizatriptan , which she takes at the onset of symptoms. If the migraine is severe and not controlled with rizatriptan , she uses eletriptan .  She had a rash that she suspects might have been caused by a B vitamin supplement, but it resolved without recurrence in just a few hours. She has avoided egg whites due to a previous test indicating  sensitivity.  No issues with hearing or vision, though her eyes water in the morning. No shortness of breath, palpitations, or changes in bowel or bladder habits.  She mentions a past injury that limits her range of motion in her knees.  Pertinent items are noted in HPI.  Coming back for COVID and flu on Friday Most Recent Depression Screen:     03/10/2024    8:30 AM 03/10/2023   10:12 AM 07/28/2017    3:07 PM 11/05/2016    1:44 PM 08/21/2016    2:11 PM  Depression screen PHQ 2/9  Decreased Interest 0 1 0 0 0  Down, Depressed, Hopeless 1 1 0 0 0  PHQ - 2 Score 1 2 0 0 0  Altered sleeping 0 1 0 0 0  Tired, decreased energy 2 1 0 0 0  Change in appetite 0 0 0 0 0  Feeling bad or failure about yourself  0 0 0 0 0  Trouble concentrating 0 0 0 0 0  Moving slowly or fidgety/restless 0 0 0 0 0  Suicidal thoughts 0 0 0 0 0   PHQ-9 Score 3 4 0 0 0  Difficult doing work/chores Not difficult at all Not difficult at all Not difficult at all       Data saved with a previous flowsheet row definition   Most Recent Anxiety Screen:      No data to display         Most Recent Fall  Screen:    03/10/2024    8:29 AM 03/10/2023   10:11 AM 07/28/2017    3:07 PM 01/12/2016    3:48 PM 03/28/2015   10:59 AM  Fall Risk   Falls in the past year? 0 1 No  No  No   Number falls in past yr: 0 0     Injury with Fall? 0 1     Comment  cracked knee cap last Oct.     Risk for fall due to : No Fall Risks No Fall Risks     Follow up Falls evaluation completed Falls evaluation completed        Data saved with a previous flowsheet row definition    Past medical history, surgical history, medications, allergies, family history and social history reviewed with patient today and changes made to appropriate areas of the chart.  Past Medical History:  Past Medical History:  Diagnosis Date   Acute diffuse otitis externa of left ear 03/10/2023   Allergy    Anxiety    Asthma    Depression    Migraine     Osteoporosis    no meds   Medications:  Current Outpatient Medications on File Prior to Visit  Medication Sig   Cholecalciferol (VITAMIN D  PO) Take 5,000 mg by mouth daily.   ciprofloxacin  (CILOXAN ) 0.3 % ophthalmic solution Place 2 drops in the left ear twice a day for 5 days.   estradiol  (VIVELLE -DOT) 0.05 MG/24HR patch Place 1 patch onto the skin 2 (two) times a week.   Multiple Vitamins-Minerals (B COMPLEX PLUS VITAMIN C PO) Take 1 capsule by mouth daily.   progesterone (PROMETRIUM) 100 MG capsule Take 100 mg by mouth daily.   L-LYSINE PO daily.   No current facility-administered medications on file prior to visit.   Surgical History:  Past Surgical History:  Procedure Laterality Date   FRACTURE SURGERY  1983   left femur   NASAL SEPTOPLASTY W/ TURBINOPLASTY Bilateral 04/17/2015   Procedure: NASAL SEPTOPLASTY WITH TURBINATE REDUCTION;  Surgeon: Marlyce Finer, MD;  Location: [facility];  Service: ENT;  Laterality: Bilateral;   NASAL SINUS SURGERY Left 04/17/2015   Procedure: ENDOSCOPIC REMOVAL ANTROCHOANAL POLYP;  Surgeon: Marlyce Finer, MD;  Location: [facility];  Service: ENT;  Laterality: Left;   TONSILLECTOMY AND ADENOIDECTOMY     WISDOM TOOTH EXTRACTION     Allergies:  Allergies  Allergen Reactions   Penicillins Other (See Comments)    Unsure reaction as infant   Beeswax Rash   Family History:  Family History  Adopted: Yes  Family history unknown: Yes       Objective:    BP 118/80   Pulse 70   Ht 5' 4 (1.626 m)   Wt 113 lb 6.4 oz (51.4 kg)   LMP 04/17/2015 Comment: irreg  BMI 19.47 kg/m   Wt Readings from Last 3 Encounters:  03/10/24 113 lb 6.4 oz (51.4 kg)  03/10/23 111 lb 9.6 oz (50.6 kg)  07/16/22 110 lb (49.9 kg)    Physical Exam Vitals and nursing note reviewed.  Constitutional:      General: She is not in acute distress.    Appearance: Normal appearance. She is normal weight. She is not ill-appearing.  HENT:      Head: Normocephalic and atraumatic.     Right Ear: Hearing, tympanic membrane, ear canal and external ear normal.     Left Ear: Hearing, tympanic membrane, ear canal and external ear  normal.     Nose: Nose normal.     Right Sinus: No maxillary sinus tenderness or frontal sinus tenderness.     Left Sinus: No maxillary sinus tenderness or frontal sinus tenderness.     Mouth/Throat:     Lips: Pink.     Mouth: Mucous membranes are moist.     Pharynx: Oropharynx is clear.  Eyes:     General: Lids are normal. Vision grossly intact.     Extraocular Movements: Extraocular movements intact.     Conjunctiva/sclera: Conjunctivae normal.     Pupils: Pupils are equal, round, and reactive to light.     Funduscopic exam:    Right eye: Red reflex present.        Left eye: Red reflex present.    Visual Fields: Right eye visual fields normal and left eye visual fields normal.  Neck:     Thyroid : No thyromegaly.     Vascular: No carotid bruit.  Cardiovascular:     Rate and Rhythm: Normal rate and regular rhythm.     Chest Wall: PMI is not displaced.     Pulses: Normal pulses.          Dorsalis pedis pulses are 2+ on the right side and 2+ on the left side.       Posterior tibial pulses are 2+ on the right side and 2+ on the left side.     Heart sounds: Normal heart sounds. No murmur heard. Pulmonary:     Effort: Pulmonary effort is normal. No respiratory distress.     Breath sounds: Normal breath sounds.  Abdominal:     General: Abdomen is flat. Bowel sounds are normal. There is no distension.     Palpations: Abdomen is soft. There is no hepatomegaly, splenomegaly or mass.     Tenderness: There is no abdominal tenderness. There is no right CVA tenderness, left CVA tenderness, guarding or rebound.  Musculoskeletal:        General: Normal range of motion.     Cervical back: Full passive range of motion without pain, normal range of motion and neck supple. No tenderness.     Right lower leg: No  edema.     Left lower leg: No edema.  Feet:     Left foot:     Toenail Condition: Left toenails are normal.  Lymphadenopathy:     Cervical: No cervical adenopathy.     Upper Body:     Right upper body: No supraclavicular adenopathy.     Left upper body: No supraclavicular adenopathy.  Skin:    General: Skin is warm and dry.     Capillary Refill: Capillary refill takes less than 2 seconds.     Nails: There is no clubbing.  Neurological:     General: No focal deficit present.     Mental Status: She is alert and oriented to person, place, and time.     GCS: GCS eye subscore is 4. GCS verbal subscore is 5. GCS motor subscore is 6.     Sensory: Sensation is intact. No sensory deficit.     Motor: Motor function is intact. No weakness.     Coordination: Coordination is intact. Coordination normal.     Gait: Gait is intact. Gait normal.     Deep Tendon Reflexes: Reflexes are normal and symmetric.  Psychiatric:        Attention and Perception: Attention normal.        Mood and Affect: Mood normal.  Speech: Speech normal.        Behavior: Behavior normal. Behavior is cooperative.        Thought Content: Thought content normal.        Cognition and Memory: Cognition and memory normal.        Judgment: Judgment normal.      Results for orders placed or performed in visit on 12/02/23  HM MAMMOGRAPHY   Collection Time: 01/26/20 12:44 PM  Result Value Ref Range   HM Mammogram 0-4 Bi-Rad 0-4 Bi-Rad, Self Reported Normal  HM PAP SMEAR   Collection Time: 01/28/20 12:48 PM  Result Value Ref Range   HM Pap smear NILM   HM MAMMOGRAPHY   Collection Time: 06/12/21 12:41 PM  Result Value Ref Range   HM Mammogram 0-4 Bi-Rad 0-4 Bi-Rad, Self Reported Normal  HM PAP SMEAR   Collection Time: 06/13/21 12:47 PM  Result Value Ref Range   HM Pap smear NILM   HM MAMMOGRAPHY   Collection Time: 08/02/22 12:51 PM  Result Value Ref Range   HM Mammogram 0-4 Bi-Rad 0-4 Bi-Rad, Self Reported  Normal  HM PAP SMEAR   Collection Time: 08/06/22 12:45 PM  Result Value Ref Range   HM Pap smear NILM        Assessment & Plan:   Problem List Items Addressed This Visit     Depression with anxiety   Chronic with no alarm symptoms present at this time. No need for adjustment to treatment.  - continue prozac  20mg  daily - continue alprazolam  0.25mg  PRN      Relevant Medications   ALPRAZolam  (XANAX ) 0.25 MG tablet   FLUoxetine  (PROZAC ) 20 MG capsule   Other Relevant Orders   CBC with Differential/Platelet   CMP14+EGFR   Asthma, mild intermittent   Well-controlled with occasional use of albuterol  inhaler. Discussed updated asthma guidelines recommending combination inhaler with inhaled corticosteroid and albuterol  for better maintenance and control. Insurance covers both options. At this time she prefers to continue with regular albuterol  due to infrequent use and concerns about steroid use affecting bone density. - Continue regular albuterol  inhaler as needed. - Monitor asthma symptoms and consider combination inhaler if symptoms worsen.      Relevant Medications   albuterol  (VENTOLIN  HFA) 108 (90 Base) MCG/ACT inhaler   Other Relevant Orders   CBC with Differential/Platelet   Allergic rhinitis   Experiencing suspected seasonal allergies with symptoms of watery eyes and nasal congestion. Symptoms are managed with lifestyle modifications. - Continue current management strategies for allergy symptoms.      Encounter for annual physical exam - Primary   CPE completed today. Review of HM activities and recommendations discussed and provided on AVS. Anticipatory guidance, diet, and exercise recommendations provided. Medications, allergies, and hx reviewed and updated as necessary. Orders placed as listed below.  Plan: - Labs ordered. Will make changes as necessary based on results.  - I will review these results and send recommendations via MyChart or a telephone call.  - F/U with  CPE in 1 year or sooner for acute/chronic health needs as directed.        Relevant Medications   acetic acid -hydrocortisone  (VOSOL -HC) OTIC solution   Other Relevant Orders   CBC with Differential/Platelet   CMP14+EGFR   Anxiety   Avoids news to prevent exacerbation. Surround yourself with positivity. Alprazolam  is used infrequently and prescription is due for renewal. PMDP reviewed.  - Refilled alprazolam  prescription.       Relevant Medications  ALPRAZolam  (XANAX ) 0.25 MG tablet   FLUoxetine  (PROZAC ) 20 MG capsule   Migraine   Migraines are well-controlled with rizatriptan  and eletriptan  as needed. Rizatriptan  is effective if taken Leshaun Biebel, while eletriptan  is used for more severe episodes when rizatriptan  single dose is not effective. - Refilled rizatriptan  prescription. - Refilled eletriptan  prescription.      Relevant Medications   rizatriptan  (MAXALT ) 10 MG tablet   FLUoxetine  (PROZAC ) 20 MG capsule   eletriptan  (RELPAX ) 40 MG tablet   Hidradenitis axillaris   No recurrent symptoms at this time. Continue with current monitoring.       Relevant Orders   CBC with Differential/Platelet   CMP14+EGFR   Chronic eczematoid otitis externa of both ears   Chronic dermatitis in the ears managed with Nizoral and hydrocortisone  with acetic acid . Symptoms are controlled with current regimen. - Continue current regimen of Nizoral and hydrocortisone  with acetic acid  as needed.       Relevant Medications   acetic acid -hydrocortisone  (VOSOL -HC) OTIC solution   Stress incontinence   Experiencing urinary urgency with stress incontinence, particularly when holding urine. Discussed potential benefits of Kegel exercises and physical therapy. - Consider Kegel exercises for urinary incontinence. - Will discuss potential referral to physical therapy if symptoms worsen.      Vitamin D  deficiency   Relevant Orders   Vitamin D , 25-hydroxy   Other Visit Diagnoses       Health care  maintenance       Relevant Medications   rizatriptan  (MAXALT ) 10 MG tablet     Vitamin B 12 deficiency       Relevant Orders   Vitamin B12       Follow up plan: Return in about 1 year (around 03/10/2025) for CPE.  NEXT PREVENTATIVE PHYSICAL DUE IN 1 YEAR.  PATIENT COUNSELING PROVIDED FOR ALL ADULT PATIENTS: A well balanced diet low in saturated fats, cholesterol, and moderation in carbohydrates.  This can be as simple as monitoring portion sizes and cutting back on sugary beverages such as soda and juice to start with.    Daily water consumption of at least 64 ounces.  Physical activity at least 180 minutes per week.  If just starting out, start 10 minutes a day and work your way up.   This can be as simple as taking the stairs instead of the elevator and walking 2-3 laps around the office  purposefully every day.   STD protection, partner selection, and regular testing if high risk.  Limited consumption of alcoholic beverages if alcohol is consumed. For men, I recommend no more than 14 alcoholic beverages per week, spread out throughout the week (max 2 per day). Avoid binge drinking or consuming large quantities of alcohol in one setting.  Please let me know if you feel you may need help with reduction or quitting alcohol consumption.   Avoidance of nicotine, if used. Please let me know if you feel you may need help with reduction or quitting nicotine use.   Daily mental health attention. This can be in the form of 5 minute daily meditation, prayer, journaling, yoga, reflection, etc.  Purposeful attention to your emotions and mental state can significantly improve your overall wellbeing  and  Health.  Please know that I am here to help you with all of your health care goals and am happy to work with you to find a solution that works best for you.  The greatest advice I have received with any changes in life are to  take it one step at a time, that even means if all you can  focus on is the next 60 seconds, then do that and celebrate your victories.  With any changes in life, you will have set backs, and that is OK. The important thing to remember is, if you have a set back, it is not a failure, it is an opportunity to try again! Screening Testing Mammogram Every 1 -2 years based on history and risk factors Starting at age 23 Pap Smear Ages 21-39 every 3 years Ages 38-65 every 5 years with HPV testing More frequent testing may be required based on results and history Colon Cancer Screening Every 1-10 years based on test performed, risk factors, and history Starting at age 14 Bone Density Screening Every 2-10 years based on history Starting at age 27 for women Recommendations for men differ based on medication usage, history, and risk factors AAA Screening One time ultrasound Men 100-25 years old who have every smoked Lung Cancer Screening Low Dose Lung CT every 12 months Age 18-80 years with a 30 pack-year smoking history who still smoke or who have quit within the last 15 years   Screening Labs Routine  Labs: Complete Blood Count (CBC), Complete Metabolic Panel (CMP), Cholesterol (Lipid Panel) Every 6-12 months based on history and medications May be recommended more frequently based on current conditions or previous results Hemoglobin A1c Lab Every 3-12 months based on history and previous results Starting at age 5 or earlier with diagnosis of diabetes, high cholesterol, BMI >26, and/or risk factors Frequent monitoring for patients with diabetes to ensure blood sugar control Thyroid  Panel (TSH) Every 6 months based on history, symptoms, and risk factors May be repeated more often if on medication HIV One time testing for all patients 68 and older May be repeated more frequently for patients with increased risk factors or exposure Hepatitis C One time testing for all patients 4 and older May be repeated more frequently for patients with  increased risk factors or exposure Gonorrhea, Chlamydia Every 12 months for all sexually active persons 13-24 years Additional monitoring may be recommended for those who are considered high risk or who have symptoms Every 12 months for any woman on birth control, regardless of sexual activity PSA Men 56-61 years old with risk factors Additional screening may be recommended from age 13-69 based on risk factors, symptoms, and history  Vaccine Recommendations Tetanus Booster All adults every 10 years Flu Vaccine All patients 6 months and older every year COVID Vaccine All patients 12 years and older Initial dosing with booster May recommend additional booster based on age and health history HPV Vaccine 2 doses all patients age 22-26 Dosing may be considered for patients over 26 Shingles Vaccine (Shingrix) 2 doses all adults 55 years and older Pneumonia (Pneumovax 40) All adults 65 years and older May recommend earlier dosing based on health history One year apart from Prevnar 40 Pneumonia (Prevnar 72) All adults 65 years and older Dosed 1 year after Pneumovax 23 Pneumonia (Prevnar 20) One time alternative to the two dosing of 13 and 23 For all adults with initial dose of 23, 20 is recommended 1 year later For all adults with initial dose of 13, 23 is still recommended as second option 1 year later

## 2024-03-10 NOTE — Assessment & Plan Note (Signed)
 Chronic with no alarm symptoms present at this time. No need for adjustment to treatment.  - continue prozac  20mg  daily - continue alprazolam  0.25mg  PRN

## 2024-03-10 NOTE — Assessment & Plan Note (Signed)
 Well-controlled with occasional use of albuterol  inhaler. Discussed updated asthma guidelines recommending combination inhaler with inhaled corticosteroid and albuterol  for better maintenance and control. Insurance covers both options. At this time she prefers to continue with regular albuterol  due to infrequent use and concerns about steroid use affecting bone density. - Continue regular albuterol  inhaler as needed. - Monitor asthma symptoms and consider combination inhaler if symptoms worsen.

## 2024-03-10 NOTE — Assessment & Plan Note (Signed)
 Experiencing urinary urgency with stress incontinence, particularly when holding urine. Discussed potential benefits of Kegel exercises and physical therapy. - Consider Kegel exercises for urinary incontinence. - Will discuss potential referral to physical therapy if symptoms worsen.

## 2024-03-10 NOTE — Assessment & Plan Note (Signed)
 Chronic dermatitis in the ears managed with Nizoral and hydrocortisone  with acetic acid . Symptoms are controlled with current regimen. - Continue current regimen of Nizoral and hydrocortisone  with acetic acid  as needed.

## 2024-03-10 NOTE — Assessment & Plan Note (Signed)
 No recurrent symptoms at this time. Continue with current monitoring.

## 2024-03-10 NOTE — Patient Instructions (Signed)
 Everything looks great!  I will let you know if any of your labs come back abnormal or show anything concerning.    For all adult patients, I recommend A well balanced diet low in saturated fats, cholesterol, and moderation in carbohydrates.   This can be as simple as monitoring portion sizes and cutting back on sugary beverages such as soda and juice to start with.    Daily water consumption of at least 64 ounces.  Physical activity at least 180 minutes per week, if just starting out.   This can be as simple as taking the stairs instead of the elevator and walking 2-3 laps around the office  purposefully every day.   STD protection, partner selection, and regular testing if high risk.  Limited consumption of alcoholic beverages if alcohol is consumed.  For women, I recommend no more than 7 alcoholic beverages per week, spread out throughout the week.  Avoid binge drinking or consuming large quantities of alcohol in one setting.   Please let me know if you feel you may need help with reduction or quitting alcohol consumption.   Avoidance of nicotine, if used.  Please let me know if you feel you may need help with reduction or quitting nicotine use.   Daily mental health attention.  This can be in the form of 5 minute daily meditation, prayer, journaling, yoga, reflection, etc.   Purposeful attention to your emotions and mental state can significantly improve your overall wellbeing  and  Health.  Please know that I am here to help you with all of your health care goals and am happy to work with you to find a solution that works best for you.  The greatest advice I have received with any changes in life are to take it one step at a time, that even means if all you can focus on is the next 60 seconds, then do that and celebrate your victories.  With any changes in life, you will have set backs, and that is OK. The important thing to remember is, if you have a set back, it is not a  failure, it is an opportunity to try again!  Health Maintenance Recommendations Screening Testing Mammogram Every 1 -2 years based on history and risk factors Starting at age 66 Pap Smear Ages 21-39 every 3 years Ages 55-65 every 5 years with HPV testing More frequent testing may be required based on results and history Colon Cancer Screening Every 1-10 years based on test performed, risk factors, and history Starting at age 84 Bone Density Screening Every 2-10 years based on history Starting at age 71 for women Recommendations for men differ based on medication usage, history, and risk factors AAA Screening One time ultrasound Men 52-94 years old who have every smoked Lung Cancer Screening Low Dose Lung CT every 12 months Age 90-80 years with a 30 pack-year smoking history who still smoke or who have quit within the last 15 years  Screening Labs Routine  Labs: Complete Blood Count (CBC), Complete Metabolic Panel (CMP), Cholesterol (Lipid Panel) Every 6-12 months based on history and medications May be recommended more frequently based on current conditions or previous results Hemoglobin A1c Lab Every 3-12 months based on history and previous results Starting at age 35 or earlier with diagnosis of diabetes, high cholesterol, BMI >26, and/or risk factors Frequent monitoring for patients with diabetes to ensure blood sugar control Thyroid  Panel (TSH w/ T3 & T4) Every 6 months based on history, symptoms,  and risk factors May be repeated more often if on medication HIV One time testing for all patients 47 and older May be repeated more frequently for patients with increased risk factors or exposure Hepatitis C One time testing for all patients 83 and older May be repeated more frequently for patients with increased risk factors or exposure Gonorrhea, Chlamydia Every 12 months for all sexually active persons 13-24 years Additional monitoring may be recommended for those who are  considered high risk or who have symptoms PSA Men 78-46 years old with risk factors Additional screening may be recommended from age 20-69 based on risk factors, symptoms, and history  Vaccine Recommendations Tetanus Booster All adults every 10 years Flu Vaccine All patients 6 months and older every year COVID Vaccine All patients 12 years and older Initial dosing with booster May recommend additional booster based on age and health history HPV Vaccine 2 doses all patients age 58-26 Dosing may be considered for patients over 26 Shingles Vaccine (Shingrix) 2 doses all adults 55 years and older Pneumonia (Pneumovax 23) All adults 65 years and older May recommend earlier dosing based on health history Pneumonia (Prevnar 20) All adults 65 years and older Dosed 1 year after Pneumovax 23  Additional Screening, Testing, and Vaccinations may be recommended on an individualized basis based on family history, health history, risk factors, and/or exposure.

## 2024-03-10 NOTE — Assessment & Plan Note (Signed)
 Migraines are well-controlled with rizatriptan  and eletriptan  as needed. Rizatriptan  is effective if taken Jasmine Herrera, while eletriptan  is used for more severe episodes when rizatriptan  single dose is not effective. - Refilled rizatriptan  prescription. - Refilled eletriptan  prescription.

## 2024-03-10 NOTE — Assessment & Plan Note (Signed)
 Avoids news to prevent exacerbation. Surround yourself with positivity. Alprazolam  is used infrequently and prescription is due for renewal. PMDP reviewed.  - Refilled alprazolam  prescription.

## 2024-03-10 NOTE — Assessment & Plan Note (Signed)
 Experiencing suspected seasonal allergies with symptoms of watery eyes and nasal congestion. Symptoms are managed with lifestyle modifications. - Continue current management strategies for allergy symptoms.

## 2024-03-10 NOTE — Assessment & Plan Note (Signed)

## 2024-03-11 LAB — CBC WITH DIFFERENTIAL/PLATELET
Basophils Absolute: 0 x10E3/uL (ref 0.0–0.2)
Basos: 1 %
EOS (ABSOLUTE): 0.3 x10E3/uL (ref 0.0–0.4)
Eos: 8 %
Hematocrit: 42.9 % (ref 34.0–46.6)
Hemoglobin: 13.8 g/dL (ref 11.1–15.9)
Immature Grans (Abs): 0 x10E3/uL (ref 0.0–0.1)
Immature Granulocytes: 0 %
Lymphocytes Absolute: 1.7 x10E3/uL (ref 0.7–3.1)
Lymphs: 39 %
MCH: 30.7 pg (ref 26.6–33.0)
MCHC: 32.2 g/dL (ref 31.5–35.7)
MCV: 96 fL (ref 79–97)
Monocytes Absolute: 0.4 x10E3/uL (ref 0.1–0.9)
Monocytes: 8 %
Neutrophils Absolute: 2 x10E3/uL (ref 1.4–7.0)
Neutrophils: 44 %
Platelets: 266 x10E3/uL (ref 150–450)
RBC: 4.49 x10E6/uL (ref 3.77–5.28)
RDW: 12.5 % (ref 11.7–15.4)
WBC: 4.4 x10E3/uL (ref 3.4–10.8)

## 2024-03-11 LAB — CMP14+EGFR
ALT: 11 IU/L (ref 0–32)
AST: 20 IU/L (ref 0–40)
Albumin: 4.5 g/dL (ref 3.8–4.9)
Alkaline Phosphatase: 89 IU/L (ref 49–135)
BUN/Creatinine Ratio: 13 (ref 9–23)
BUN: 9 mg/dL (ref 6–24)
Bilirubin Total: 0.5 mg/dL (ref 0.0–1.2)
CO2: 23 mmol/L (ref 20–29)
Calcium: 9.8 mg/dL (ref 8.7–10.2)
Chloride: 101 mmol/L (ref 96–106)
Creatinine, Ser: 0.68 mg/dL (ref 0.57–1.00)
Globulin, Total: 2.4 g/dL (ref 1.5–4.5)
Glucose: 63 mg/dL — AB (ref 70–99)
Potassium: 4.5 mmol/L (ref 3.5–5.2)
Sodium: 138 mmol/L (ref 134–144)
Total Protein: 6.9 g/dL (ref 6.0–8.5)
eGFR: 100 mL/min/1.73 (ref >59)

## 2024-03-11 LAB — VITAMIN B12: Vitamin B-12: 347 pg/mL (ref 232–1245)

## 2024-03-11 LAB — VITAMIN D 25 HYDROXY (VIT D DEFICIENCY, FRACTURES): Vit D, 25-Hydroxy: 55.9 ng/mL (ref 30.0–100.0)

## 2024-03-12 ENCOUNTER — Other Ambulatory Visit

## 2024-03-16 ENCOUNTER — Ambulatory Visit: Payer: Self-pay | Admitting: Nurse Practitioner

## 2024-04-16 ENCOUNTER — Ambulatory Visit

## 2024-04-16 DIAGNOSIS — Z23 Encounter for immunization: Secondary | ICD-10-CM | POA: Diagnosis not present

## 2024-04-16 NOTE — Progress Notes (Signed)
 Patient is in office today for a nurse visit for Immunization. Patient Injection was given in the  Left deltoid. Patient tolerated injection well.   Patient is in office today for a nurse visit for Immunization. Patient Injection was given in the  Right deltoid. Patient tolerated injection well.

## 2024-04-26 ENCOUNTER — Telehealth: Payer: Self-pay | Admitting: Nurse Practitioner

## 2024-04-26 ENCOUNTER — Ambulatory Visit: Admitting: Family Medicine

## 2024-04-26 ENCOUNTER — Encounter: Payer: Self-pay | Admitting: Family Medicine

## 2024-04-26 ENCOUNTER — Ambulatory Visit: Payer: Self-pay

## 2024-04-26 DIAGNOSIS — R102 Pelvic and perineal pain unspecified side: Secondary | ICD-10-CM

## 2024-04-26 DIAGNOSIS — M546 Pain in thoracic spine: Secondary | ICD-10-CM

## 2024-04-26 DIAGNOSIS — M62838 Other muscle spasm: Secondary | ICD-10-CM

## 2024-04-26 DIAGNOSIS — M542 Cervicalgia: Secondary | ICD-10-CM

## 2024-04-26 MED ORDER — MELOXICAM 15 MG PO TABS: 15.0000 mg | ORAL_TABLET | Freq: Every day | ORAL | 0 refills | Status: AC

## 2024-04-26 MED ORDER — METHOCARBAMOL 500 MG PO TABS: 500.0000 mg | ORAL_TABLET | Freq: Three times a day (TID) | ORAL | 0 refills | Status: AC | PRN

## 2024-04-26 NOTE — Patient Instructions (Signed)
 Go to [facility] at [facility] tomorrow morning for your x-rays. Use heat and gentle stretches for your neck, back and hips tonight. You can try using SalonPas patches with lidocaine , as needed for pain.  Take the meloxicam  once daily with food--if it bothers your stomach you can cut the dose in half. You can also add taking either prilosec OTC or pepcid (famotidine) to help protect your stomach from this anti-inflammatory. Take the methocarbamol  as needed for muscle spasm.  This usually doesn't make people sleepy--use with caution at first.  If your pain isn't improving, or if your muscle pain is worsening, the next step would be physical therapy.

## 2024-04-26 NOTE — Telephone Encounter (Signed)
 I attempted to call patient to see if she wanted to come in for an appointment today with another provider in the office as her provider is booked. If patient calls back please schedule with another provider or call the office if you can't see an open appointment.

## 2024-04-26 NOTE — Telephone Encounter (Signed)
 error
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
## 2024-04-26 NOTE — Telephone Encounter (Signed)
 Called [facility] and advised Rosina of ER Refusal and patient wanting to see her PCP

## 2024-04-27 ENCOUNTER — Other Ambulatory Visit: Payer: Self-pay | Admitting: Family Medicine

## 2024-04-27 ENCOUNTER — Ambulatory Visit
Admission: RE | Admit: 2024-04-27 | Discharge: 2024-04-27 | Disposition: A | Source: Ambulatory Visit | Attending: Family Medicine | Admitting: Family Medicine

## 2024-04-27 DIAGNOSIS — M546 Pain in thoracic spine: Secondary | ICD-10-CM | POA: Diagnosis not present

## 2024-04-27 DIAGNOSIS — M25559 Pain in unspecified hip: Secondary | ICD-10-CM | POA: Diagnosis not present

## 2024-04-27 DIAGNOSIS — M542 Cervicalgia: Secondary | ICD-10-CM

## 2024-04-27 DIAGNOSIS — M62838 Other muscle spasm: Secondary | ICD-10-CM

## 2024-04-27 DIAGNOSIS — R102 Pelvic and perineal pain unspecified side: Secondary | ICD-10-CM

## 2024-04-29 ENCOUNTER — Ambulatory Visit: Payer: Self-pay | Admitting: Family Medicine

## 2024-05-14 ENCOUNTER — Other Ambulatory Visit: Payer: Self-pay

## 2024-06-01 ENCOUNTER — Telehealth: Payer: Self-pay

## 2024-06-01 NOTE — Telephone Encounter (Signed)
 Copied from CRM [id number]. Topic: General - Other >> Jun 01, 2024  9:14 AM Kevelyn M wrote: Reason for CRM: Patient is calling to ask why she had an appointment for A1C check. Can respond through Mychart.

## 2024-06-08 ENCOUNTER — Ambulatory Visit: Admitting: Family Medicine

## 2025-04-14 ENCOUNTER — Encounter: Admitting: Nurse Practitioner
